# Patient Record
Sex: Female | Born: 1970 | Race: Black or African American | Hispanic: No | Marital: Single | Smoking: Never smoker
Health system: Southern US, Community
[De-identification: ages and names within clinical notes are randomized; demographics above are authoritative.]

## PROBLEM LIST (undated history)

## (undated) DIAGNOSIS — I509 Heart failure, unspecified: Secondary | ICD-10-CM

## (undated) HISTORY — DX: Heart failure, unspecified: I50.9

---

## 1998-05-07 ENCOUNTER — Inpatient Hospital Stay (HOSPITAL_COMMUNITY): Admission: AD | Admit: 1998-05-07 | Discharge: 1998-05-07 | Payer: Self-pay | Admitting: Obstetrics

## 1998-08-08 ENCOUNTER — Ambulatory Visit (HOSPITAL_COMMUNITY): Admission: RE | Admit: 1998-08-08 | Discharge: 1998-08-08 | Payer: Self-pay | Admitting: Obstetrics

## 1998-10-10 ENCOUNTER — Inpatient Hospital Stay (HOSPITAL_COMMUNITY): Admission: RE | Admit: 1998-10-10 | Discharge: 1998-10-10 | Payer: Self-pay | Admitting: Obstetrics

## 1998-10-11 ENCOUNTER — Encounter: Payer: Self-pay | Admitting: Obstetrics

## 1998-10-11 ENCOUNTER — Inpatient Hospital Stay (HOSPITAL_COMMUNITY): Admission: AD | Admit: 1998-10-11 | Discharge: 1998-10-12 | Payer: Self-pay | Admitting: *Deleted

## 1998-10-26 ENCOUNTER — Encounter: Admission: RE | Admit: 1998-10-26 | Discharge: 1998-10-26 | Payer: Self-pay | Admitting: Obstetrics & Gynecology

## 1998-10-30 ENCOUNTER — Inpatient Hospital Stay (HOSPITAL_COMMUNITY): Admission: AD | Admit: 1998-10-30 | Discharge: 1998-11-06 | Payer: Self-pay | Admitting: *Deleted

## 1998-11-01 ENCOUNTER — Encounter: Payer: Self-pay | Admitting: *Deleted

## 1998-11-08 ENCOUNTER — Inpatient Hospital Stay (HOSPITAL_COMMUNITY): Admission: AD | Admit: 1998-11-08 | Discharge: 1998-11-11 | Payer: Self-pay | Admitting: *Deleted

## 1998-11-12 HISTORY — PX: TUBAL LIGATION: SHX77

## 1998-11-17 ENCOUNTER — Encounter: Admission: RE | Admit: 1998-11-17 | Discharge: 1998-11-17 | Payer: Self-pay | Admitting: Obstetrics

## 1998-12-03 ENCOUNTER — Inpatient Hospital Stay (HOSPITAL_COMMUNITY): Admission: AD | Admit: 1998-12-03 | Discharge: 1998-12-05 | Payer: Self-pay | Admitting: Obstetrics

## 1999-02-19 ENCOUNTER — Emergency Department (HOSPITAL_COMMUNITY): Admission: EM | Admit: 1999-02-19 | Discharge: 1999-02-19 | Payer: Self-pay

## 1999-06-06 ENCOUNTER — Encounter: Payer: Self-pay | Admitting: Family Medicine

## 1999-06-06 ENCOUNTER — Ambulatory Visit (HOSPITAL_COMMUNITY): Admission: RE | Admit: 1999-06-06 | Discharge: 1999-06-06 | Payer: Self-pay | Admitting: Family Medicine

## 1999-11-21 ENCOUNTER — Inpatient Hospital Stay (HOSPITAL_COMMUNITY): Admission: AD | Admit: 1999-11-21 | Discharge: 1999-11-21 | Payer: Self-pay | Admitting: *Deleted

## 2000-08-14 ENCOUNTER — Emergency Department (HOSPITAL_COMMUNITY): Admission: EM | Admit: 2000-08-14 | Discharge: 2000-08-14 | Payer: Self-pay | Admitting: *Deleted

## 2000-08-20 ENCOUNTER — Emergency Department (HOSPITAL_COMMUNITY): Admission: EM | Admit: 2000-08-20 | Discharge: 2000-08-20 | Payer: Self-pay | Admitting: *Deleted

## 2001-08-15 ENCOUNTER — Encounter: Admission: RE | Admit: 2001-08-15 | Discharge: 2001-11-13 | Payer: Self-pay | Admitting: Orthopedic Surgery

## 2002-08-18 ENCOUNTER — Inpatient Hospital Stay (HOSPITAL_COMMUNITY): Admission: AD | Admit: 2002-08-18 | Discharge: 2002-08-18 | Payer: Self-pay | Admitting: *Deleted

## 2002-08-18 ENCOUNTER — Encounter: Payer: Self-pay | Admitting: *Deleted

## 2003-06-25 ENCOUNTER — Inpatient Hospital Stay (HOSPITAL_COMMUNITY): Admission: AD | Admit: 2003-06-25 | Discharge: 2003-06-25 | Payer: Self-pay | Admitting: Obstetrics & Gynecology

## 2003-07-01 ENCOUNTER — Inpatient Hospital Stay (HOSPITAL_COMMUNITY): Admission: AD | Admit: 2003-07-01 | Discharge: 2003-07-01 | Payer: Self-pay | Admitting: *Deleted

## 2003-07-16 ENCOUNTER — Emergency Department (HOSPITAL_COMMUNITY): Admission: EM | Admit: 2003-07-16 | Discharge: 2003-07-16 | Payer: Self-pay

## 2005-03-16 ENCOUNTER — Emergency Department (HOSPITAL_COMMUNITY): Admission: EM | Admit: 2005-03-16 | Discharge: 2005-03-16 | Payer: Self-pay | Admitting: Emergency Medicine

## 2013-11-09 ENCOUNTER — Other Ambulatory Visit: Payer: Self-pay

## 2017-09-27 ENCOUNTER — Emergency Department (HOSPITAL_COMMUNITY)
Admission: EM | Admit: 2017-09-27 | Discharge: 2017-09-27 | Disposition: A | Discharge status: Home / Self Care | Payer: No Typology Code available for payment source | Attending: Emergency Medicine | Admitting: Emergency Medicine

## 2017-09-27 ENCOUNTER — Emergency Department (HOSPITAL_COMMUNITY): Payer: No Typology Code available for payment source

## 2017-09-27 ENCOUNTER — Encounter (HOSPITAL_COMMUNITY): Payer: Self-pay | Admitting: Emergency Medicine

## 2017-09-27 DIAGNOSIS — Y999 Unspecified external cause status: Secondary | ICD-10-CM | POA: Insufficient documentation

## 2017-09-27 DIAGNOSIS — S0990XA Unspecified injury of head, initial encounter: Secondary | ICD-10-CM | POA: Insufficient documentation

## 2017-09-27 DIAGNOSIS — Y92410 Unspecified street and highway as the place of occurrence of the external cause: Secondary | ICD-10-CM | POA: Insufficient documentation

## 2017-09-27 DIAGNOSIS — Y9389 Activity, other specified: Secondary | ICD-10-CM | POA: Insufficient documentation

## 2017-09-27 DIAGNOSIS — S161XXA Strain of muscle, fascia and tendon at neck level, initial encounter: Secondary | ICD-10-CM | POA: Insufficient documentation

## 2017-09-27 DIAGNOSIS — S199XXA Unspecified injury of neck, initial encounter: Secondary | ICD-10-CM | POA: Diagnosis present

## 2017-09-27 MED ORDER — METHOCARBAMOL 500 MG PO TABS: 500.0000 mg | ORAL_TABLET | Freq: Two times a day (BID) | ORAL | 0 refills | Status: DC

## 2017-09-27 MED ORDER — METHOCARBAMOL 500 MG PO TABS
500.0000 mg | ORAL_TABLET | Freq: Once | ORAL | Status: AC
  Administered 2017-09-27: 500 mg via ORAL
  Filled 2017-09-27: qty 1

## 2017-09-27 MED ORDER — IBUPROFEN 800 MG PO TABS: 800.0000 mg | ORAL_TABLET | Freq: Three times a day (TID) | ORAL | 0 refills | Status: DC
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
## 2017-09-27 NOTE — ED Notes (Signed)
Report given to ED RN

## 2017-09-27 NOTE — Discharge Instructions (Signed)
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
## 2017-10-02 ENCOUNTER — Other Ambulatory Visit: Payer: Self-pay

## 2017-10-02 ENCOUNTER — Emergency Department (HOSPITAL_BASED_OUTPATIENT_CLINIC_OR_DEPARTMENT_OTHER)
Admission: EM | Admit: 2017-10-02 | Discharge: 2017-10-02 | Disposition: A | Discharge status: Home / Self Care | Payer: No Typology Code available for payment source | Attending: Emergency Medicine | Admitting: Emergency Medicine

## 2017-10-02 ENCOUNTER — Encounter (HOSPITAL_BASED_OUTPATIENT_CLINIC_OR_DEPARTMENT_OTHER): Payer: Self-pay | Admitting: Emergency Medicine

## 2017-10-02 DIAGNOSIS — R51 Headache: Secondary | ICD-10-CM | POA: Diagnosis present

## 2017-10-02 DIAGNOSIS — Y92410 Unspecified street and highway as the place of occurrence of the external cause: Secondary | ICD-10-CM | POA: Diagnosis not present

## 2017-10-02 DIAGNOSIS — Y999 Unspecified external cause status: Secondary | ICD-10-CM | POA: Insufficient documentation

## 2017-10-02 DIAGNOSIS — Y939 Activity, unspecified: Secondary | ICD-10-CM | POA: Diagnosis not present

## 2017-10-02 DIAGNOSIS — Y929 Unspecified place or not applicable: Secondary | ICD-10-CM | POA: Insufficient documentation

## 2017-10-02 MED ORDER — TRAMADOL HCL 50 MG PO TABS: 50.0000 mg | ORAL_TABLET | Freq: Four times a day (QID) | ORAL | 0 refills | Status: DC | PRN
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
## 2017-10-02 NOTE — ED Notes (Signed)
ED Provider at bedside. 

## 2017-10-02 NOTE — Discharge Instructions (Signed)
Stop ibuprofen and methocarbamol.  Take Tylenol for mild pain or the pain medicine prescribed for bad pain.  Call the number on these instructions in 2 days to get a primary care physician.  Your blood pressure should be rechecked by your primary care physician or at an urgent care center in a week.
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
## 2017-10-02 NOTE — ED Notes (Signed)
Pt requesting a work note for Monday and Tuesday, informed that the ED can only write notes for a few days, pt states "just forget it then."  Pt verbalizes understanding of dc instructions and denies any further needs at this time.

## 2017-10-02 NOTE — ED Notes (Signed)
Updated pt's visitor to plan of care
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
## 2020-05-13 ENCOUNTER — Encounter (HOSPITAL_COMMUNITY): Payer: Self-pay | Admitting: Emergency Medicine

## 2020-05-13 ENCOUNTER — Emergency Department (HOSPITAL_COMMUNITY): Payer: 59

## 2020-05-13 ENCOUNTER — Emergency Department (HOSPITAL_COMMUNITY)
Admission: EM | Admit: 2020-05-13 | Discharge: 2020-05-13 | Disposition: A | Discharge status: Home / Self Care | Payer: 59 | Attending: Emergency Medicine | Admitting: Emergency Medicine

## 2020-05-13 ENCOUNTER — Other Ambulatory Visit: Payer: Self-pay

## 2020-05-13 DIAGNOSIS — J40 Bronchitis, not specified as acute or chronic: Secondary | ICD-10-CM

## 2020-05-13 DIAGNOSIS — R911 Solitary pulmonary nodule: Secondary | ICD-10-CM | POA: Insufficient documentation

## 2020-05-13 DIAGNOSIS — R000 Tachycardia, unspecified: Secondary | ICD-10-CM | POA: Diagnosis not present

## 2020-05-13 DIAGNOSIS — Z79899 Other long term (current) drug therapy: Secondary | ICD-10-CM | POA: Insufficient documentation

## 2020-05-13 DIAGNOSIS — R042 Hemoptysis: Secondary | ICD-10-CM | POA: Diagnosis present

## 2020-05-13 LAB — CBC
HCT: 31.8 % — ABNORMAL LOW (ref 36.0–46.0)
Hemoglobin: 9.7 g/dL — ABNORMAL LOW (ref 12.0–15.0)
MCH: 22.4 pg — ABNORMAL LOW (ref 26.0–34.0)
MCHC: 30.5 g/dL (ref 30.0–36.0)
MCV: 73.4 fL — ABNORMAL LOW (ref 80.0–100.0)
Platelets: 498 10*3/uL — ABNORMAL HIGH (ref 150–400)
RBC: 4.33 MIL/uL (ref 3.87–5.11)
RDW: 18.5 % — ABNORMAL HIGH (ref 11.5–15.5)
WBC: 11.1 10*3/uL — ABNORMAL HIGH (ref 4.0–10.5)
nRBC: 0 % (ref 0.0–0.2)

## 2020-05-13 LAB — I-STAT BETA HCG BLOOD, ED (MC, WL, AP ONLY): I-stat hCG, quantitative: <5 m[IU]/mL (ref <5)

## 2020-05-13 LAB — BASIC METABOLIC PANEL
Anion gap: 13 (ref 5–15)
BUN: 11 mg/dL (ref 6–20)
CO2: 18 mmol/L — ABNORMAL LOW (ref 22–32)
Calcium: 8.6 mg/dL — ABNORMAL LOW (ref 8.9–10.3)
Chloride: 104 mmol/L (ref 98–111)
Creatinine, Ser: 0.99 mg/dL (ref 0.44–1.00)
GFR calc Af Amer: >60 mL/min (ref >60)
GFR calc non Af Amer: >60 mL/min (ref >60)
Glucose, Bld: 114 mg/dL — ABNORMAL HIGH (ref 70–99)
Potassium: 3.1 mmol/L — ABNORMAL LOW (ref 3.5–5.1)
Sodium: 135 mmol/L (ref 135–145)

## 2020-05-13 LAB — TROPONIN I (HIGH SENSITIVITY)
Troponin I (High Sensitivity): 36 ng/L — ABNORMAL HIGH (ref <18)
Troponin I (High Sensitivity): 33 ng/L — ABNORMAL HIGH (ref <18)

## 2020-05-13 MED ORDER — AZITHROMYCIN 250 MG PO TABS: 250.0000 mg | ORAL_TABLET | Freq: Every day | ORAL | 0 refills | Status: DC

## 2020-05-13 MED ORDER — IOHEXOL 350 MG/ML SOLN
80.0000 mL | Freq: Once | INTRAVENOUS | Status: AC | PRN
  Administered 2020-05-13: 80 mL via INTRAVENOUS

## 2020-05-13 MED ORDER — AMLODIPINE BESYLATE 5 MG PO TABS: 5.0000 mg | ORAL_TABLET | Freq: Every day | ORAL | 1 refills | Status: DC

## 2020-05-13 MED ORDER — SODIUM CHLORIDE 0.9% FLUSH
3.0000 mL | Freq: Once | INTRAVENOUS | Status: AC
  Administered 2020-05-13: 3 mL via INTRAVENOUS

## 2020-05-13 NOTE — ED Notes (Signed)
Provided pt with a bag lunch and a sprite per DR

## 2020-05-13 NOTE — ED Triage Notes (Signed)
Pt c/o productive cough with streaks of pink in sputum, shob, +chills/hot, shob, chest pressure

## 2020-05-13 NOTE — ED Provider Notes (Signed)
[facility] EMERGENCY DEPARTMENT Provider Note   CSN: [id number] Arrival date & time: 05/13/20  0138     History Chief Complaint  Patient presents with  . Hemoptysis    Nancy Branch is a 49 y.o. female.  Patient presenting with a complaint of a productive cough with streaks of pink sputum.  Some shortness of breath chills and hot feeling.  And some chest pressure.  Patient vital signs significant for persistent tachycardia.  Came in with a heart rate of 129.  No fever.  Respiratory rate 20 blood pressure 173/108.  Oxygen saturations 100%.  Patient does have a primary care doctor apparently has been referred to cardiology regarding the fast heart rate which is been going on for several weeks now.        History reviewed. No pertinent past medical history.  There are no problems to display for this patient.   History reviewed. No pertinent surgical history.   OB History   No obstetric history on file.     No family history on file.  Social History   Tobacco Use  . Smoking status: Never Smoker  . Smokeless tobacco: Never Used  Vaping Use  . Vaping Use: Never used  Substance Use Topics  . Alcohol use: Yes    Comment: occasional  . Drug use: Not Currently    Home Medications Prior to Admission medications   Medication Sig Start Date End Date Taking? Authorizing Provider  cetirizine (ZYRTEC) 10 MG tablet Take 10 mg by mouth daily as needed for allergies. 03/31/20  Yes [provider]  ELDERBERRY PO Take 1 tablet by mouth daily.   Yes [provider]  ferrous sulfate 325 (65 FE) MG tablet Take 325 mg by mouth daily with breakfast.   Yes [provider]  fluticasone (FLONASE) 50 MCG/ACT nasal spray Place 2 sprays into both nostrils daily. 03/31/20  Yes [provider]  meloxicam (MOBIC) 15 MG tablet Take 15 mg by mouth daily as needed for pain. 03/31/20  Yes [provider]  montelukast (SINGULAIR) 10 MG  tablet Take 10 mg by mouth daily. 04/26/20  Yes [provider]  telmisartan (MICARDIS) 40 MG tablet Take 40 mg by mouth daily. 03/31/20  Yes [provider]  vitamin B-12 (CYANOCOBALAMIN) 500 MCG tablet Take 500 mcg by mouth daily.   Yes [provider]  zinc gluconate 50 MG tablet Take 50 mg by mouth daily.   Yes [provider]  amLODipine (NORVASC) 5 MG tablet Take 1 tablet (5 mg total) by mouth daily. 05/13/20   Vanetta Mulders, MD  azithromycin (ZITHROMAX) 250 MG tablet Take 1 tablet (250 mg total) by mouth daily. Take first 2 tablets together, then 1 every day until finished. 05/13/20   Vanetta Mulders, MD  ibuprofen (ADVIL,MOTRIN) 800 MG tablet Take 1 tablet (800 mg total) 3 (three) times daily by mouth. Patient not taking: Reported on 05/13/2020 09/27/17   Long, Arlyss Repress, MD  methocarbamol (ROBAXIN) 500 MG tablet Take 1 tablet (500 mg total) 2 (two) times daily by mouth. Patient not taking: Reported on 05/13/2020 09/27/17   Long, Arlyss Repress, MD  traMADol (ULTRAM) 50 MG tablet Take 1 tablet (50 mg total) by mouth every 6 (six) hours as needed. Patient not taking: Reported on 05/13/2020 10/02/17   Doug Sou, MD  triamcinolone cream (KENALOG) 0.1 % Apply 1 application topically 2 (two) times daily. 05/04/20   [provider]    Allergies  Robaxin [methocarbamol] and Oxycodone  Review of Systems   Review of Systems  Constitutional: Negative for chills and fever.  HENT: Negative for congestion, rhinorrhea and sore throat.   Eyes: Negative for visual disturbance.  Respiratory: Positive for cough and shortness of breath.   Cardiovascular: Positive for chest pain, palpitations and leg swelling.  Gastrointestinal: Negative for abdominal pain, diarrhea, nausea and vomiting.  Genitourinary: Negative for dysuria.  Musculoskeletal: Negative for back pain and neck pain.  Skin: Negative for rash.  Neurological: Negative for dizziness, light-headedness  and headaches.  Hematological: Does not bruise/bleed easily.  Psychiatric/Behavioral: Negative for confusion.    Physical Exam Updated Vital Signs BP (!) 163/112   Pulse (!) 108   Temp 98.6 F (37 C) (Oral)   Resp (!) 28   SpO2 100%   Physical Exam Vitals and nursing note reviewed.  Constitutional:      General: She is not in acute distress.    Appearance: Normal appearance. She is well-developed.  HENT:     Head: Normocephalic and atraumatic.  Eyes:     Extraocular Movements: Extraocular movements intact.     Conjunctiva/sclera: Conjunctivae normal.     Pupils: Pupils are equal, round, and reactive to light.  Cardiovascular:     Rate and Rhythm: Regular rhythm. Tachycardia present.     Heart sounds: No murmur heard.   Pulmonary:     Effort: Pulmonary effort is normal. No respiratory distress.     Breath sounds: Normal breath sounds.  Abdominal:     Palpations: Abdomen is soft.     Tenderness: There is no abdominal tenderness.  Musculoskeletal:        General: No swelling. Normal range of motion.     Cervical back: Normal range of motion and neck supple.     Comments: Patient felt her legs were swollen.  But no evidence of any swelling on exam.  Certainly no pitting edema.  Skin:    General: Skin is warm and dry.  Neurological:     General: No focal deficit present.     Mental Status: She is alert and oriented to person, place, and time.     ED Results / Procedures / Treatments   Labs (all labs ordered are listed, but only abnormal results are displayed) Labs Reviewed  BASIC METABOLIC PANEL - Abnormal; Notable for the following components:      Result Value   Potassium 3.1 (*)    CO2 18 (*)    Glucose, Bld 114 (*)    Calcium 8.6 (*)    All other components within normal limits  CBC - Abnormal; Notable for the following components:   WBC 11.1 (*)    Hemoglobin 9.7 (*)    HCT 31.8 (*)    MCV 73.4 (*)    MCH 22.4 (*)    RDW 18.5 (*)    Platelets 498 (*)      All other components within normal limits  TROPONIN I (HIGH SENSITIVITY) - Abnormal; Notable for the following components:   Troponin I (High Sensitivity) 36 (*)    All other components within normal limits  TROPONIN I (HIGH SENSITIVITY) - Abnormal; Notable for the following components:   Troponin I (High Sensitivity) 33 (*)    All other components within normal limits  I-STAT BETA HCG BLOOD, ED ([facility], [facility], [facility] ONLY)    EKG EKG Interpretation  Date/Time:  Friday May 13 2020 01:46:53 EDT Ventricular Rate:  124 PR Interval:  122 QRS Duration:  106 QT Interval:  352 QTC Calculation: 505 R Axis:   -67 Text Interpretation: Sinus tachycardia with Premature atrial complexes Possible Left atrial enlargement Left axis deviation Pulmonary disease pattern Minimal voltage criteria for LVH, may be normal variant ( Cornell product ) Septal infarct , age undetermined Inferior infarct , age undetermined ST & T wave abnormality, consider lateral ischemia Abnormal ECG No previous ECGs available Confirmed by Vanetta Mulders [id number]) on 05/13/2020 8:26:15 AM   Radiology DG Chest 2 View  Result Date: 05/13/2020 CLINICAL DATA:  Chest pain, productive cough, shortness of breath EXAM: CHEST - 2 VIEW COMPARISON:  None. FINDINGS: The heart size and mediastinal contours are within normal limits. Both lungs are clear. The visualized skeletal structures are unremarkable. IMPRESSION: No active cardiopulmonary disease. Electronically Signed   By: Sharlet Salina M.D.   On: 05/13/2020 02:13   CT Angio Chest PE W/Cm &/Or Wo Cm  Result Date: 05/13/2020 CLINICAL DATA:  Shortness of breath. EXAM: CT ANGIOGRAPHY CHEST WITH CONTRAST TECHNIQUE: Multidetector CT imaging of the chest was performed using the standard protocol during bolus administration of intravenous contrast. Multiplanar CT image reconstructions and MIPs were obtained to evaluate the vascular anatomy. CONTRAST:  42mL OMNIPAQUE IOHEXOL 350 MG/ML SOLN COMPARISON:   None. FINDINGS: Cardiovascular: Satisfactory opacification of the pulmonary arteries to the segmental level. No evidence of pulmonary embolism. Normal heart size. No pericardial effusion. Mediastinum/Nodes: No enlarged mediastinal, hilar, or axillary lymph nodes. Thyroid gland, trachea, and esophagus demonstrate no significant findings. Lungs/Pleura: No pneumothorax or pleural effusion is noted. Minimal biapical scarring is noted 7 mm ground-glass opacity is noted in right upper lobe best seen on image number 48 of series 6. Upper Abdomen: 2.9 cm low density is noted in dome of right hepatic lobe Musculoskeletal: No chest wall abnormality. No acute or significant osseous findings. Review of the MIP images confirms the above findings. IMPRESSION: 1. No definite evidence of pulmonary embolus. 2. 7 mm ground-glass opacity is noted in right upper lobe. Follow-up CT scan in 3 months is recommended to ensure stability or resolution. 3. 2.9 cm low density is noted in dome of right hepatic lobe. Hepatic ultrasound is recommended for further evaluation. Electronically Signed   By: Lupita Raider M.D.   On: 05/13/2020 12:24    Procedures Procedures (including critical care time)  Medications Ordered in ED Medications  sodium chloride flush (NS) 0.9 % injection 3 mL (3 mLs Intravenous Given 05/13/20 0823)  iohexol (OMNIPAQUE) 350 MG/ML injection 80 mL (80 mLs Intravenous Contrast Given 05/13/20 1150)    ED Course  I have reviewed the triage vital signs and the nursing notes.  Pertinent labs & imaging results that were available during my care of the patient were reviewed by me and considered in my medical decision making (see chart for details).    MDM Rules/Calculators/A&P                          Patient's troponins x2 were in the 30s but the second 1 was lower.  EKG was kind of sinus tachycardia with some premature atrial contractions.  Initial chest x-ray without any acute findings but due to the  persistent type tachycardia patient get CT angio chest.  I which showed no evidence of pulmonary embolus.  Also showed no evidence of pneumonia.  So her productive cough with some streaking of blood most likely secondary to a bronchitis.  Was treated with a Z-Pak for that.  Patient's blood pressure remained elevated here.  So we will start her on Norvasc for that and have her follow-up as already scheduled with cardiology and will go back to follow-up with her primary care doctor for the pulmonary nodule found on the CT angio.  And also for evaluation of the cyst found on the liver.  None of that requires immediate attention today.    Final Clinical Impression(s) / ED Diagnoses Final diagnoses:  Tachycardia  Bronchitis  Pulmonary nodule    Rx / DC Orders ED Discharge Orders         Ordered    azithromycin (ZITHROMAX) 250 MG tablet  Daily     Discontinue  Reprint     05/13/20 1436    amLODipine (NORVASC) 5 MG tablet  Daily     Discontinue  Reprint     05/13/20 1439           Vanetta Mulders, MD 05/13/20 1447

## 2020-05-13 NOTE — ED Notes (Signed)
Reviewed discharge paperwork with patient. No further questions for ED staff at this time.

## 2020-05-13 NOTE — Discharge Instructions (Signed)
Work-up here today without evidence of pneumonia or blood clots on the CT of the chest.  But he picked up a pulmonary nodule and cyst on the liver both which will require follow-up.  Feel that some of your symptoms are due to a bronchitis with take the antibiotic as Z-Pak as directed.  Make an appointment to follow-up with your regular doctor.  Keep your appointment to follow-up with cardiology.  Return for any new or worse symptoms.  Also evidence of high blood pressure here today.  Follow-up with your primary care doctor to see the trend.  If it trends the way it is will be needed if started on some antihypertensive meds.  Cardiology may be willing to address that as well.  Cardiac work-up here today without any acute findings.  Prescription provided to start Norvasc.  For the high blood pressure.

## 2020-05-13 NOTE — ED Notes (Signed)
Pt transported to CT ?

## 2020-05-13 NOTE — ED Notes (Signed)
Got patient into a gown on the monitor patient is resting with call bell in reach 

## 2020-07-16 ENCOUNTER — Inpatient Hospital Stay (HOSPITAL_COMMUNITY)
Admission: EM | Admit: 2020-07-16 | Discharge: 2020-07-19 | DRG: 293 | Disposition: A | Discharge status: Home / Self Care | Payer: 59 | Attending: Internal Medicine | Admitting: Internal Medicine

## 2020-07-16 ENCOUNTER — Encounter (HOSPITAL_COMMUNITY): Payer: Self-pay | Admitting: Emergency Medicine

## 2020-07-16 ENCOUNTER — Emergency Department (HOSPITAL_COMMUNITY): Payer: 59

## 2020-07-16 ENCOUNTER — Other Ambulatory Visit: Payer: Self-pay

## 2020-07-16 DIAGNOSIS — R600 Localized edema: Secondary | ICD-10-CM

## 2020-07-16 DIAGNOSIS — R0601 Orthopnea: Secondary | ICD-10-CM

## 2020-07-16 DIAGNOSIS — I110 Hypertensive heart disease with heart failure: Principal | ICD-10-CM | POA: Diagnosis present

## 2020-07-16 DIAGNOSIS — Z20822 Contact with and (suspected) exposure to covid-19: Secondary | ICD-10-CM | POA: Diagnosis present

## 2020-07-16 DIAGNOSIS — Z23 Encounter for immunization: Secondary | ICD-10-CM

## 2020-07-16 DIAGNOSIS — R609 Edema, unspecified: Secondary | ICD-10-CM | POA: Diagnosis present

## 2020-07-16 DIAGNOSIS — I10 Essential (primary) hypertension: Secondary | ICD-10-CM | POA: Diagnosis present

## 2020-07-16 DIAGNOSIS — I509 Heart failure, unspecified: Secondary | ICD-10-CM

## 2020-07-16 DIAGNOSIS — K760 Fatty (change of) liver, not elsewhere classified: Secondary | ICD-10-CM | POA: Diagnosis present

## 2020-07-16 DIAGNOSIS — I43 Cardiomyopathy in diseases classified elsewhere: Secondary | ICD-10-CM | POA: Diagnosis present

## 2020-07-16 DIAGNOSIS — R002 Palpitations: Secondary | ICD-10-CM | POA: Diagnosis not present

## 2020-07-16 DIAGNOSIS — R072 Precordial pain: Secondary | ICD-10-CM

## 2020-07-16 DIAGNOSIS — I5041 Acute combined systolic (congestive) and diastolic (congestive) heart failure: Secondary | ICD-10-CM | POA: Diagnosis present

## 2020-07-16 DIAGNOSIS — R7989 Other specified abnormal findings of blood chemistry: Secondary | ICD-10-CM

## 2020-07-16 DIAGNOSIS — R079 Chest pain, unspecified: Secondary | ICD-10-CM | POA: Diagnosis present

## 2020-07-16 DIAGNOSIS — E876 Hypokalemia: Secondary | ICD-10-CM | POA: Diagnosis present

## 2020-07-16 DIAGNOSIS — I5020 Unspecified systolic (congestive) heart failure: Secondary | ICD-10-CM

## 2020-07-16 DIAGNOSIS — J42 Unspecified chronic bronchitis: Secondary | ICD-10-CM | POA: Diagnosis present

## 2020-07-16 LAB — CBC
HCT: 31.6 % — ABNORMAL LOW (ref 36.0–46.0)
Hemoglobin: 9.4 g/dL — ABNORMAL LOW (ref 12.0–15.0)
MCH: 20.5 pg — ABNORMAL LOW (ref 26.0–34.0)
MCHC: 29.7 g/dL — ABNORMAL LOW (ref 30.0–36.0)
MCV: 69 fL — ABNORMAL LOW (ref 80.0–100.0)
Platelets: 489 10*3/uL — ABNORMAL HIGH (ref 150–400)
RBC: 4.58 MIL/uL (ref 3.87–5.11)
RDW: 20.4 % — ABNORMAL HIGH (ref 11.5–15.5)
WBC: 8.5 10*3/uL (ref 4.0–10.5)
nRBC: 0.8 % — ABNORMAL HIGH (ref 0.0–0.2)

## 2020-07-16 LAB — BASIC METABOLIC PANEL
Anion gap: 11 (ref 5–15)
BUN: 18 mg/dL (ref 6–20)
CO2: 21 mmol/L — ABNORMAL LOW (ref 22–32)
Calcium: 8.7 mg/dL — ABNORMAL LOW (ref 8.9–10.3)
Chloride: 106 mmol/L (ref 98–111)
Creatinine, Ser: 1.08 mg/dL — ABNORMAL HIGH (ref 0.44–1.00)
GFR calc Af Amer: >60 mL/min (ref >60)
GFR calc non Af Amer: >60 mL/min (ref >60)
Glucose, Bld: 112 mg/dL — ABNORMAL HIGH (ref 70–99)
Potassium: 3.9 mmol/L (ref 3.5–5.1)
Sodium: 138 mmol/L (ref 135–145)

## 2020-07-16 LAB — I-STAT BETA HCG BLOOD, ED (MC, WL, AP ONLY): I-stat hCG, quantitative: <5 m[IU]/mL (ref <5)

## 2020-07-16 LAB — BRAIN NATRIURETIC PEPTIDE: B Natriuretic Peptide: 3260.2 pg/mL — ABNORMAL HIGH (ref 0.0–100.0)

## 2020-07-16 LAB — TROPONIN I (HIGH SENSITIVITY)
Troponin I (High Sensitivity): 34 ng/L — ABNORMAL HIGH (ref <18)
Troponin I (High Sensitivity): 30 ng/L — ABNORMAL HIGH (ref <18)

## 2020-07-16 MED ORDER — ACETAMINOPHEN 500 MG PO TABS
1000.0000 mg | ORAL_TABLET | Freq: Once | ORAL | Status: AC
  Administered 2020-07-16: 1000 mg via ORAL
  Filled 2020-07-16: qty 2

## 2020-07-16 MED ORDER — IOHEXOL 300 MG/ML  SOLN
100.0000 mL | Freq: Once | INTRAMUSCULAR | Status: AC | PRN
  Administered 2020-07-16: 100 mL via INTRAVENOUS

## 2020-07-16 NOTE — ED Triage Notes (Addendum)
Pt presents to ED POV. Pt c/o L CP and BLE edema. Pt reports that s/s began x2w. Pt also c/o orthopnea and reports copious productive cough when lying flat. tachynpneic in triage

## 2020-07-16 NOTE — ED Provider Notes (Addendum)
[facility] EMERGENCY DEPARTMENT Provider Note   CSN: [id number] Arrival date & time: 07/16/20  1910     History Chief Complaint  Patient presents with  . Chest Pain    Nancy Branch is a 49 y.o. female.  Patient seen by me on July 2 for very similar presentation.  What is new that now she has bilateral leg swelling.  Patient with complaint of substernal chest pain shortness of breath.  And a productive cough.  Chest pain is been ongoing since yesterday.  But has been present since about a week ago.  End of August patient was seen by cardiology on August 26.  Also seen by pulmonary medicine August 31.  They are in the process of trying to evaluate this complaint.  When I saw patient on July 2 we did CT angio of the chest without evidence of any pulmonary abnormalities or pulmonary embolus.  Also troponins x2 were in the 30 range.  Patient did not have any leg swelling at that time.  Pulmonary medicine and cardiology in the process of trying to work-up the complaint.  Denies any fevers.        History reviewed. No pertinent past medical history.  There are no problems to display for this patient.   History reviewed. No pertinent surgical history.   OB History   No obstetric history on file.     History reviewed. No pertinent family history.  Social History   Tobacco Use  . Smoking status: Never Smoker  . Smokeless tobacco: Never Used  Vaping Use  . Vaping Use: Never used  Substance Use Topics  . Alcohol use: Yes    Comment: occasional  . Drug use: Not Currently    Home Medications Prior to Admission medications   Medication Sig Start Date End Date Taking? Authorizing Provider  amLODipine (NORVASC) 5 MG tablet Take 1 tablet (5 mg total) by mouth daily. 05/13/20   Vanetta MuldersZackowski, Viviana Trimble, MD  azithromycin (ZITHROMAX) 250 MG tablet Take 1 tablet (250 mg total) by mouth daily. Take first 2 tablets together, then 1 every day until finished. 05/13/20   Vanetta MuldersZackowski,  Marvalene Barrett, MD  cetirizine (ZYRTEC) 10 MG tablet Take 10 mg by mouth daily as needed for allergies. 03/31/20   [provider]  ELDERBERRY PO Take 1 tablet by mouth daily.    [provider]  ferrous sulfate 325 (65 FE) MG tablet Take 325 mg by mouth daily with breakfast.    [provider]  fluticasone (FLONASE) 50 MCG/ACT nasal spray Place 2 sprays into both nostrils daily. 03/31/20   [provider]  ibuprofen (ADVIL,MOTRIN) 800 MG tablet Take 1 tablet (800 mg total) 3 (three) times daily by mouth. Patient not taking: Reported on 05/13/2020 09/27/17   Long, Arlyss RepressJoshua G, MD  meloxicam (MOBIC) 15 MG tablet Take 15 mg by mouth daily as needed for pain. 03/31/20   [provider]  methocarbamol (ROBAXIN) 500 MG tablet Take 1 tablet (500 mg total) 2 (two) times daily by mouth. Patient not taking: Reported on 05/13/2020 09/27/17   Long, Arlyss RepressJoshua G, MD  montelukast (SINGULAIR) 10 MG tablet Take 10 mg by mouth daily. 04/26/20   [provider]  telmisartan (MICARDIS) 40 MG tablet Take 40 mg by mouth daily. 03/31/20   [provider]  traMADol (ULTRAM) 50 MG tablet Take 1 tablet (50 mg total) by mouth every 6 (six) hours as needed. Patient not taking: Reported on 05/13/2020 10/02/17   Jacubowitz,  Sam, MD  triamcinolone cream (KENALOG) 0.1 % Apply 1 application topically 2 (two) times daily. 05/04/20   [provider]  vitamin B-12 (CYANOCOBALAMIN) 500 MCG tablet Take 500 mcg by mouth daily.    [provider]  zinc gluconate 50 MG tablet Take 50 mg by mouth daily.    [provider]    Allergies    Robaxin [methocarbamol] and Oxycodone  Review of Systems   Review of Systems  Constitutional: Negative for chills and fever.  HENT: Negative for rhinorrhea and sore throat.   Eyes: Negative for visual disturbance.  Respiratory: Positive for cough and shortness of breath.   Cardiovascular: Positive for chest pain and leg swelling.    Gastrointestinal: Negative for abdominal pain, diarrhea, nausea and vomiting.  Genitourinary: Negative for dysuria.  Musculoskeletal: Negative for back pain and neck pain.  Skin: Negative for rash.  Neurological: Negative for dizziness, light-headedness and headaches.  Hematological: Does not bruise/bleed easily.  Psychiatric/Behavioral: Negative for confusion.    Physical Exam Updated Vital Signs BP (!) 144/90 (BP Location: Right Arm)   Pulse (!) 113   Temp 99.6 F (37.6 C) (Oral)   Resp 19   SpO2 100%   Physical Exam Vitals and nursing note reviewed.  Constitutional:      General: She is not in acute distress.    Appearance: Normal appearance. She is well-developed.  HENT:     Head: Normocephalic and atraumatic.  Eyes:     Extraocular Movements: Extraocular movements intact.     Conjunctiva/sclera: Conjunctivae normal.     Pupils: Pupils are equal, round, and reactive to light.  Cardiovascular:     Rate and Rhythm: Regular rhythm. Tachycardia present.     Heart sounds: No murmur heard.   Pulmonary:     Effort: Pulmonary effort is normal. No respiratory distress.     Breath sounds: Normal breath sounds. No wheezing.  Abdominal:     Palpations: Abdomen is soft.     Tenderness: There is no abdominal tenderness.  Musculoskeletal:     Cervical back: Normal range of motion and neck supple.     Right lower leg: Edema present.     Left lower leg: Edema present.  Skin:    General: Skin is warm and dry.     Capillary Refill: Capillary refill takes less than 2 seconds.  Neurological:     General: No focal deficit present.     Mental Status: She is alert and oriented to person, place, and time.     Cranial Nerves: No cranial nerve deficit.     Sensory: No sensory deficit.     Motor: No weakness.     ED Results / Procedures / Treatments   Labs (all labs ordered are listed, but only abnormal results are displayed) Labs Reviewed  BASIC METABOLIC PANEL - Abnormal;  Notable for the following components:      Result Value   CO2 21 (*)    Glucose, Bld 112 (*)    Creatinine, Ser 1.08 (*)    Calcium 8.7 (*)    All other components within normal limits  CBC - Abnormal; Notable for the following components:   Hemoglobin 9.4 (*)    HCT 31.6 (*)    MCV 69.0 (*)    MCH 20.5 (*)    MCHC 29.7 (*)    RDW 20.4 (*)    Platelets 489 (*)    nRBC 0.8 (*)    All other components within normal limits  BRAIN NATRIURETIC PEPTIDE - Abnormal; Notable for the following components:   B Natriuretic Peptide 3,260.2 (*)    All other components within normal limits  TROPONIN I (HIGH SENSITIVITY) - Abnormal; Notable for the following components:   Troponin I (High Sensitivity) 34 (*)    All other components within normal limits  I-STAT BETA HCG BLOOD, ED ([facility], [facility], [facility] ONLY)  TROPONIN I (HIGH SENSITIVITY)    EKG EKG Interpretation  Date/Time:  Saturday July 16 2020 19:15:54 EDT Ventricular Rate:  121 PR Interval:  128 QRS Duration: 96 QT Interval:  356 QTC Calculation: 505 R Axis:   -58 Text Interpretation: Sinus tachycardia with Premature atrial complexes with Abberant conduction Possible Left atrial enlargement Left axis deviation Anterior infarct , age undetermined Abnormal ECG No significant change since last tracing Confirmed by Vanetta Mulders [id number]) on 07/16/2020 8:57:37 PM   Radiology DG Chest 2 View  Result Date: 07/16/2020 CLINICAL DATA:  Chest pain EXAM: CHEST - 2 VIEW COMPARISON:  May 13, 2020 FINDINGS: The heart size and mediastinal contours are within normal limits. Aortic knob calcifications are seen. Both lungs are clear. The visualized skeletal structures are unremarkable. IMPRESSION: No active cardiopulmonary disease. Electronically Signed   By: Jonna Clark M.D.   On: 07/16/2020 20:10    Procedures Procedures (including critical care time)  Medications Ordered in ED Medications - No data to display  ED Course  I have reviewed the  triage vital signs and the nursing notes.  Pertinent labs & imaging results that were available during my care of the patient were reviewed by me and considered in my medical decision making (see chart for details).    MDM Rules/Calculators/A&P                            Patient EKG without any acute findings.  Tachycardia in the low 100s.  Oxygen saturation 100% on room air.  Respiratory rate is 18.  Blood pressure 146 systolic.  Temp was 99.6.  Chest x-ray again without any acute changes.  Renal function without any significant abnormalities.  No significant changes in hemoglobin at 9.4 from July.  Initial troponin here today was 34.  The patient since has had worsening chest pain today we will do delta troponin.  Also have added on BMP since she does have bilateral lower extremity edema.  Patient denies any abdominal pain.  Again as stated chest x-ray shows no evidence of any pulmonary edema.  Will follow up on BMP will follow up on delta troponin.  If no acute changes we will have patient follow back up with pulmonary medicine and cardiology that she is already plugged into.  They are planning additional follow-up.  Patient nontoxic no acute distress.     Final Clinical Impression(s) / ED Diagnoses Final diagnoses:  Precordial pain  Palpitations    Rx / DC Orders ED Discharge Orders    None       Vanetta Mulders, MD 07/16/20 2143  Addendum: I ordered a BMP this time because of the leg swelling which was new compared to the last time I saw her in July.  BNP was markedly elevated.  Delta troponin is still pending.  Based on the elevated BNP and her significant leg swelling.  Will order just a CT chest with out.  Not worried about pulmonary embolus.  Patient had CT angio back in July that was completely negative.  Concern here would be congestive  heart failure.  Even though plain chest x-ray showed no evidence of any pulmonary edema.      Vanetta Mulders, MD 07/16/20  2219  CT of chest shows no evidence of pulmonary edema or any enlargement of heart size.  So patient's markedly elevated BNP not explained by congestive heart failure.  Or even probable pulmonary hypertension.  Patient does have significant lower extremity swelling.  Some concern now with a negative CT that may be there is something obstructing in the pelvis or abdomen area.  We will go ahead and get CT abdomen and pelvis with contrast since patient is here and try to get this sorted out.    Vanetta Mulders, MD 07/16/20 [phone]

## 2020-07-16 NOTE — ED Notes (Signed)
Pt transported to CT ?

## 2020-07-17 ENCOUNTER — Inpatient Hospital Stay (HOSPITAL_COMMUNITY): Payer: 59

## 2020-07-17 DIAGNOSIS — J42 Unspecified chronic bronchitis: Secondary | ICD-10-CM | POA: Diagnosis present

## 2020-07-17 DIAGNOSIS — I43 Cardiomyopathy in diseases classified elsewhere: Secondary | ICD-10-CM | POA: Diagnosis present

## 2020-07-17 DIAGNOSIS — E876 Hypokalemia: Secondary | ICD-10-CM | POA: Diagnosis present

## 2020-07-17 DIAGNOSIS — R600 Localized edema: Secondary | ICD-10-CM

## 2020-07-17 DIAGNOSIS — R079 Chest pain, unspecified: Secondary | ICD-10-CM | POA: Diagnosis not present

## 2020-07-17 DIAGNOSIS — R0601 Orthopnea: Secondary | ICD-10-CM | POA: Diagnosis not present

## 2020-07-17 DIAGNOSIS — R609 Edema, unspecified: Secondary | ICD-10-CM

## 2020-07-17 DIAGNOSIS — I509 Heart failure, unspecified: Secondary | ICD-10-CM | POA: Diagnosis not present

## 2020-07-17 DIAGNOSIS — I10 Essential (primary) hypertension: Secondary | ICD-10-CM

## 2020-07-17 DIAGNOSIS — Z20822 Contact with and (suspected) exposure to covid-19: Secondary | ICD-10-CM | POA: Diagnosis present

## 2020-07-17 DIAGNOSIS — Z23 Encounter for immunization: Secondary | ICD-10-CM | POA: Diagnosis not present

## 2020-07-17 DIAGNOSIS — I5021 Acute systolic (congestive) heart failure: Secondary | ICD-10-CM | POA: Diagnosis not present

## 2020-07-17 DIAGNOSIS — I110 Hypertensive heart disease with heart failure: Secondary | ICD-10-CM | POA: Diagnosis present

## 2020-07-17 DIAGNOSIS — R002 Palpitations: Secondary | ICD-10-CM | POA: Diagnosis present

## 2020-07-17 DIAGNOSIS — I5041 Acute combined systolic (congestive) and diastolic (congestive) heart failure: Secondary | ICD-10-CM | POA: Diagnosis present

## 2020-07-17 DIAGNOSIS — K760 Fatty (change of) liver, not elsewhere classified: Secondary | ICD-10-CM | POA: Diagnosis present

## 2020-07-17 LAB — ECHOCARDIOGRAM COMPLETE
Area-P 1/2: 3.77 cm2
Height: 65 in
S' Lateral: 4.2 cm
Weight: 2566.4 oz

## 2020-07-17 LAB — SARS CORONAVIRUS 2 BY RT PCR (HOSPITAL ORDER, PERFORMED IN ~~LOC~~ HOSPITAL LAB): SARS Coronavirus 2: NEGATIVE

## 2020-07-17 LAB — TROPONIN I (HIGH SENSITIVITY): Troponin I (High Sensitivity): 32 ng/L — ABNORMAL HIGH (ref <18)

## 2020-07-17 LAB — HIV ANTIBODY (ROUTINE TESTING W REFLEX): HIV Screen 4th Generation wRfx: NONREACTIVE

## 2020-07-17 MED ORDER — CARVEDILOL 3.125 MG PO TABS: 3.1250 mg | ORAL_TABLET | Freq: Two times a day (BID) | ORAL | Status: DC

## 2020-07-17 MED ORDER — TRAMADOL HCL 50 MG PO TABS
50.0000 mg | ORAL_TABLET | Freq: Four times a day (QID) | ORAL | Status: DC | PRN
  Administered 2020-07-17 – 2020-07-18 (×4): 50 mg via ORAL
  Filled 2020-07-17 (×4): qty 1

## 2020-07-17 MED ORDER — ZINC SULFATE 220 (50 ZN) MG PO CAPS
220.0000 mg | ORAL_CAPSULE | Freq: Every day | ORAL | Status: DC
  Administered 2020-07-17 – 2020-07-19 (×3): 220 mg via ORAL
  Filled 2020-07-17 (×3): qty 1

## 2020-07-17 MED ORDER — AMLODIPINE BESYLATE 5 MG PO TABS: 5.0000 mg | ORAL_TABLET | Freq: Every day | ORAL | Status: DC

## 2020-07-17 MED ORDER — ASPIRIN EC 81 MG PO TBEC
81.0000 mg | DELAYED_RELEASE_TABLET | Freq: Every day | ORAL | Status: DC
  Administered 2020-07-17 – 2020-07-19 (×3): 81 mg via ORAL
  Filled 2020-07-17 (×3): qty 1

## 2020-07-17 MED ORDER — ONDANSETRON HCL 4 MG/2ML IJ SOLN: 4.0000 mg | Freq: Four times a day (QID) | INTRAMUSCULAR | Status: DC | PRN

## 2020-07-17 MED ORDER — FERROUS SULFATE 325 (65 FE) MG PO TABS
325.0000 mg | ORAL_TABLET | Freq: Every day | ORAL | Status: DC
  Administered 2020-07-17 – 2020-07-19 (×3): 325 mg via ORAL
  Filled 2020-07-17 (×3): qty 1

## 2020-07-17 MED ORDER — ENOXAPARIN SODIUM 40 MG/0.4ML ~~LOC~~ SOLN
40.0000 mg | Freq: Every day | SUBCUTANEOUS | Status: DC
  Administered 2020-07-17 – 2020-07-19 (×3): 40 mg via SUBCUTANEOUS
  Filled 2020-07-17 (×3): qty 0.4

## 2020-07-17 MED ORDER — PNEUMOCOCCAL VAC POLYVALENT 25 MCG/0.5ML IJ INJ
0.5000 mL | INJECTION | INTRAMUSCULAR | Status: AC
  Administered 2020-07-19: 0.5 mL via INTRAMUSCULAR
  Filled 2020-07-17: qty 0.5

## 2020-07-17 MED ORDER — SODIUM CHLORIDE 0.9% FLUSH
3.0000 mL | INTRAVENOUS | Status: DC | PRN
  Administered 2020-07-17: 3 mL via INTRAVENOUS

## 2020-07-17 MED ORDER — FLUTICASONE PROPIONATE 50 MCG/ACT NA SUSP
2.0000 | Freq: Every day | NASAL | Status: DC | PRN
  Filled 2020-07-17: qty 16

## 2020-07-17 MED ORDER — SODIUM CHLORIDE 0.9 % IV SOLN: 250.0000 mL | INTRAVENOUS | Status: DC | PRN

## 2020-07-17 MED ORDER — PREDNISONE 20 MG PO TABS
40.0000 mg | ORAL_TABLET | Freq: Every day | ORAL | Status: DC
  Administered 2020-07-17 – 2020-07-19 (×3): 40 mg via ORAL
  Filled 2020-07-17 (×3): qty 2

## 2020-07-17 MED ORDER — SODIUM CHLORIDE 0.9% FLUSH
3.0000 mL | Freq: Two times a day (BID) | INTRAVENOUS | Status: DC
  Administered 2020-07-17 – 2020-07-19 (×5): 3 mL via INTRAVENOUS

## 2020-07-17 MED ORDER — HYDRALAZINE HCL 25 MG PO TABS
25.0000 mg | ORAL_TABLET | Freq: Four times a day (QID) | ORAL | Status: DC | PRN
  Administered 2020-07-17: 25 mg via ORAL
  Filled 2020-07-17: qty 1

## 2020-07-17 MED ORDER — LISINOPRIL 10 MG PO TABS: 10.0000 mg | ORAL_TABLET | Freq: Every day | ORAL | Status: DC

## 2020-07-17 MED ORDER — IRBESARTAN 300 MG PO TABS
150.0000 mg | ORAL_TABLET | Freq: Every day | ORAL | Status: DC
  Administered 2020-07-17 – 2020-07-18 (×2): 150 mg via ORAL
  Filled 2020-07-17 (×2): qty 1

## 2020-07-17 MED ORDER — MONTELUKAST SODIUM 10 MG PO TABS
10.0000 mg | ORAL_TABLET | Freq: Every day | ORAL | Status: DC
  Administered 2020-07-17 – 2020-07-19 (×3): 10 mg via ORAL
  Filled 2020-07-17 (×3): qty 1

## 2020-07-17 MED ORDER — ACETAMINOPHEN 325 MG PO TABS: 650.0000 mg | ORAL_TABLET | ORAL | Status: DC | PRN

## 2020-07-17 MED ORDER — IBUPROFEN 200 MG PO TABS: 400.0000 mg | ORAL_TABLET | ORAL | Status: DC | PRN

## 2020-07-17 MED ORDER — FUROSEMIDE 10 MG/ML IJ SOLN
20.0000 mg | Freq: Once | INTRAMUSCULAR | Status: AC
  Administered 2020-07-17: 20 mg via INTRAVENOUS
  Filled 2020-07-17: qty 2

## 2020-07-17 MED ORDER — FUROSEMIDE 10 MG/ML IJ SOLN
40.0000 mg | Freq: Two times a day (BID) | INTRAMUSCULAR | Status: DC
  Administered 2020-07-17 – 2020-07-19 (×5): 40 mg via INTRAVENOUS
  Filled 2020-07-17 (×6): qty 4

## 2020-07-17 MED ORDER — ALBUTEROL SULFATE (2.5 MG/3ML) 0.083% IN NEBU: 2.5000 mg | INHALATION_SOLUTION | Freq: Four times a day (QID) | RESPIRATORY_TRACT | Status: DC | PRN

## 2020-07-17 MED ORDER — ALBUTEROL SULFATE (2.5 MG/3ML) 0.083% IN NEBU
2.5000 mg | INHALATION_SOLUTION | Freq: Four times a day (QID) | RESPIRATORY_TRACT | Status: DC
  Administered 2020-07-17: 2.5 mg via RESPIRATORY_TRACT
  Filled 2020-07-17 (×2): qty 3

## 2020-07-17 MED ORDER — FUROSEMIDE 10 MG/ML IJ SOLN: 40.0000 mg | Freq: Two times a day (BID) | INTRAMUSCULAR | Status: DC

## 2020-07-17 NOTE — ED Provider Notes (Signed)
I assumed care of this patient.  Please see previous provider note for further details of Hx, PE.  Briefly patient is a 49 y.o. female who presented for bilateral lower extremity edema with orthopnea and dyspnea on exertion.  Labs notable for elevated BNP.  Chest x-ray and CT of the chest negative for any pulmonary edema.  Currently pending CT of the abdomen to assess for any obstructive mass pressing on the femoral veins/IVC.  CT of the abdomen relatively benign.  Incidental findings were relayed to the patient.  On my reassessment patient had increased work of breathing with very minimal exertion.  Feel that she would benefit from admission to the hospital for Lasix and an echo to assess for likely new onset heart failure.  First dose of IV Lasix given in the emergency department.      Nira Conn, MD 07/17/20 [id number]

## 2020-07-17 NOTE — H&P (Addendum)
Nancy Branch is an 49 y.o. female.    From: Home  Chief Complaint: Shortness of breath, left precordial chest pain, bilateral leg swelling.  HPI: The patient is a 49 yr old woman who states that she has been struggling with respiratory issues for at least a couple of months. She was actually seen in this ED on 05/13/2020 for the same complaints, although she did not have the lower extremity edema at that time. CTA of the chest at that time was negative for PE. She was given a Z-pack. She is being cared for by both cardiology and pulmonology from [facility]. The patient was seen by pulmonology at [facility] on 8/31/202. The patient declined spirometry. She was complaining of cough productive of sputum at that time. She was placed on a steroid taper.   Currently the patient is complaining of 1-2 weeks of continual left precordial chest pain. She states that this is a dull pain that become sharp with deep respirations or cough. She has also had increasing swelling of her lower extremities bilaterally. She states that she has had no appetite in the last couple of weeks. She denies fevers, chills, hematemesis, melena, hematochezia, or coffee ground emesis.   In the ED today the patient has a very high blood pressure. Her troponin is only slightly elevated at 34 and is trending down at 30. BNP was 3260.2. CT of the chest was performed today and demonstrated no cause for the patient's symptoms. CT abdomen and pelvis was also preformed. The liver was found to have hepatic steatosis, there was left colonic diverticulosis, but no diverticulitis. There was also aortic atherosclerosis and a small amount of free fluid in the pelvis. CT abdomen and pelvis was obtained due to concern for venous/lymphatic obstruction of the lower extremities. None was seen.  Past medical history is significant for hypertension and chronic bronchitis. She denies any past cardiac history, diabetes, renal problems, or swelling.  [facility] were consulted to admit the patient for further evaluation and treatment.  History reviewed. No pertinent past medical history.  History reviewed. No pertinent surgical history.  History reviewed. No pertinent family history. Social History:  reports that she has never smoked. She has never used smokeless tobacco. She reports current alcohol use. She reports previous drug use. (Not in a hospital admission)   Allergies:  Allergies  Allergen Reactions  . Robaxin [Methocarbamol] Itching  . Oxycodone     itching    Pertinent items noted in HPI and remainder of comprehensive ROS otherwise negative.  General appearance: alert, cooperative and mild distress Head: Normocephalic, without obvious abnormality, atraumatic Eyes: conjunctivae/corneas clear. PERRL, EOM's intact. Fundi benign. Throat: lips, mucosa, and tongue normal; teeth and gums normal Neck: no adenopathy, no carotid bruit, no JVD, supple, symmetrical, trachea midline, thyroid not enlarged, symmetric, no tenderness/mass/nodules and 13 cm JVD Resp: Positive for increased work of breathing. Positive for scattered rales and rhonchi throughout. No wheezes. Chest wall: no tenderness Cardio: regular rate and rhythm, S1, S2 normal, no murmur, click, rub or gallop GI: soft, non-tender; bowel sounds normal; no masses,  no organomegaly Extremities: edema 4+ and no cyanosis or clubbing Pulses: 2+ and symmetric Skin: Skin color, texture, turgor normal. No rashes or lesions Lymph nodes: Cervical, supraclavicular, and axillary nodes normal. Neurologic: Alert and oriented X 3, normal strength and tone. Normal symmetric reflexes. Normal coordination and gait  Results for orders placed or performed during the hospital encounter of 07/16/20 (from the past 48 hour(s))  Basic  metabolic panel     Status: Abnormal   Collection Time: 07/16/20  7:30 PM  Result Value Ref Range   Sodium 138 135 - 145 mmol/L   Potassium 3.9 3.5 - 5.1  mmol/L   Chloride 106 98 - 111 mmol/L   CO2 21 (L) 22 - 32 mmol/L   Glucose, Bld 112 (H) 70 - 99 mg/dL    Comment: Glucose reference range applies only to samples taken after fasting for at least 8 hours.   BUN 18 6 - 20 mg/dL   Creatinine, Ser 5.88 (H) 0.44 - 1.00 mg/dL   Calcium 8.7 (L) 8.9 - 10.3 mg/dL   GFR calc non Af Amer >60 >60 mL/min   GFR calc Af Amer >60 >60 mL/min   Anion gap 11 5 - 15    Comment: Performed at [facility], [address], [place], [place] [address]  CBC     Status: Abnormal   Collection Time: 07/16/20  7:30 PM  Result Value Ref Range   WBC 8.5 4.0 - 10.5 K/uL   RBC 4.58 3.87 - 5.11 MIL/uL   Hemoglobin 9.4 (L) 12.0 - 15.0 g/dL   HCT 41.2 (L) 36 - 46 %   MCV 69.0 (L) 80.0 - 100.0 fL   MCH 20.5 (L) 26.0 - 34.0 pg   MCHC 29.7 (L) 30.0 - 36.0 g/dL   RDW 87.8 (H) 67.6 - 72.0 %   Platelets 489 (H) 150 - 400 K/uL   nRBC 0.8 (H) 0.0 - 0.2 %    Comment: Performed at [facility], [address], [place], [place] [address]  Troponin I (High Sensitivity)     Status: Abnormal   Collection Time: 07/16/20  7:30 PM  Result Value Ref Range   Troponin I (High Sensitivity) 34 (H) <18 ng/L    Comment: (NOTE) Elevated high sensitivity troponin I (hsTnI) values and significant  changes across serial measurements may suggest ACS but many other  chronic and acute conditions are known to elevate hsTnI results.  Refer to the "Links" section for chest pain algorithms and additional  guidance. Performed at [facility], [address] [place], [place] [address]   Brain natriuretic peptide     Status: Abnormal   Collection Time: 07/16/20  7:30 PM  Result Value Ref Range   B Natriuretic Peptide 3,260.2 (H) 0.0 - 100.0 pg/mL    Comment: Performed at [facility], [address], [place], [place] [address]  I-Stat beta hCG blood, ED     Status: None   Collection Time: 07/16/20  9:09 PM  Result Value Ref Range   I-stat hCG, quantitative <5.0 <5  mIU/mL   Comment 3            Comment:   GEST. AGE      CONC.  (mIU/mL)   <=1 WEEK        5 - 50     2 WEEKS       50 - 500     3 WEEKS       100 - 10,000     4 WEEKS     1,000 - 30,000        FEMALE AND NON-PREGNANT FEMALE:     LESS THAN 5 mIU/mL   Troponin I (High Sensitivity)     Status: Abnormal   Collection Time: 07/16/20 10:26 PM  Result Value Ref Range   Troponin I (High Sensitivity) 30 (H) <  18 ng/L    Comment: (NOTE) Elevated high sensitivity troponin I (hsTnI) values and significant  changes across serial measurements may suggest ACS but many other  chronic and acute conditions are known to elevate hsTnI results.  Refer to the "Links" section for chest pain algorithms and additional  guidance. Performed at [facility], [address] [place], [place] [address]    @[email or web address] @  Blood pressure (!) 173/110, pulse (!) 107, temperature 99.6 F (37.6 C), temperature source Oral, resp. rate 19, height 5\' 5"  (1.651 m), weight 77.1 kg, SpO2 99 %.    Assessment/Plan Problem  Chf (Congestive Heart Failure) (Hcc)  Uncontrolled Hypertension  Chronic Bronchitis (Hcc)  Chest Pain  Peripheral Edema   CHF/Peripheral edema: Elevated BNP with recent onset 3-4+ pitting edema of lower extremities bilaterally. Troponins are slightly elevated at 34, but trending downwards. Possibly a consequence of her uncontrolled hypertension, although she states that her blood pressures are high today because she did not takes her meds today. Echocardiogram is ordered. Heart failure consult has been ordered. They will need to be called in the am. She will be diuresed and I's and O's will be followed closely.   Chest pain: Likely due to bronchitis as opposed to ischemia. Will continue to follow troponins and repeat EKG in the am to rule out MI by EKG and enzyme criteria. The patient will have albuterol available as needed. ED did not feel that CTA chest was necessary as the patient had a similar  presentation on 05/13/2020 with a negative CTA chest. She is not hypoxic and there is no hypotension. She is saturating 100% on room air. I will order a D-dimer and follow up with CTA chest if positive.  Chronic Bronchitis: Nebs and steroids to be continued. There is no increased WBC or fevers. She is saturating 100% on room air.  Uncontrolled Hypertension: Will continue the patient on irbesartan as at home. Will hold norvasc due to concern for CHF.   I have seen and examined this patient myself. I have spent 82 minutes in her evaluation and care.  DVT prophylaxis: Lovenox CODE STATUS: Full Code Family Communication: None available Disposition: The patient will be admitted to a progressive bed as inpatient.  Abou Sterkel 07/17/2020, 1:20 AM

## 2020-07-17 NOTE — Progress Notes (Signed)
PROGRESS NOTE    [name]   ERX:[id number]  DOB: 1970/11/15  DOA: 07/16/2020 PCP: Nancy Canary, PA-C   Brief Narrative:  Nancy Branch is a 49 yr old woman who states that she has been struggling with respiratory issues for at least a couple of months. She states she was treated for a bronchitis last month when she was seen in the ED (05/13/20). CTA of the chest was negative for PE at that time and she was given a Z-pack. She is being cared for by both cardiology and pulmonology from [facility]. The patient was seen by pulmonology at [facility] on 8/31/202. She states her cough has not resolved since being treated for her acute bronchitis and she feels that she has developed dyspnea on exertion. She has also developed severe lower extremity pedal edema and left sided chest pain.   Subjective: Chest pain and cough are slightly better today. Pedal edema continues to be severe.     Assessment & Plan:   Principal Problem:   CHF (congestive heart failure)  - will follow ECHO - cont Lasix and follow I and O - cont Prednisone as well   Active Problems: Chest pain - likely from her cough- added Ibuprofen    Uncontrolled hypertension - Hydralazine, Irbesartan     Time spent in minutes: 35 DVT prophylaxis: Lovenox Code Status: Full code Family Communication:  Disposition Plan:  Status is: Inpatient  Remains inpatient appropriate because:cont IV Lasix   Dispo: The patient is from: Home              Anticipated d/c is to: Home              Anticipated d/c date is: 3 days              Patient currently is not medically stable to d/c.      Consultants:   none Procedures:   none Antimicrobials:  Anti-infectives (From admission, onward)   None       Objective: Vitals:   07/17/20 0830 07/17/20 0900 07/17/20 0930 07/17/20 1004  BP: (!) 146/106 (!) 154/101 (!) 146/92 (!) 131/94  Pulse: 74 (!) 114 (!) 117 (!) 115  Resp: 19 (!) 24  20  Temp:    98.2 F (36.8 C)    TempSrc:    Oral  SpO2: 94% 99% 99% 99%  Weight:    72.8 kg  Height:    5\' 5"  (1.651 m)    Intake/Output Summary (Last 24 hours) at 07/17/2020 1424 Last data filed at 07/17/2020 1047 Gross per 24 hour  Intake 240 ml  Output 200 ml  Net 40 ml   Filed Weights   07/16/20 2219 07/17/20 1004  Weight: 77.1 kg 72.8 kg    Examination: General exam: Appears comfortable  HEENT: PERRLA, oral mucosa moist, no sclera icterus or thrush Respiratory system: Clear to auscultation. Respiratory effort normal. Cardiovascular system: S1 & S2 heard, RRR.   Gastrointestinal system: Abdomen soft, non-tender, nondistended. Normal bowel sounds. Central nervous system: Alert and oriented. No focal neurological deficits. Extremities: No cyanosis, clubbing - 3 + edema Skin: No rashes or ulcers Psychiatry:  Mood & affect appropriate.     Data Reviewed: I have personally reviewed following labs and imaging studies  CBC: Recent Labs  Lab 07/16/20 1930  WBC 8.5  HGB 9.4*  HCT 31.6*  MCV 69.0*  PLT 489*   Basic Metabolic Panel: Recent Labs  Lab 07/16/20 1930  NA 138  K 3.9  CL 106  CO2 21*  GLUCOSE 112*  BUN 18  CREATININE 1.08*  CALCIUM 8.7*   GFR: Estimated Creatinine Clearance: 63 mL/min (A) (by C-G formula based on SCr of 1.08 mg/dL (H)). Liver Function Tests: No results for input(s): AST, ALT, ALKPHOS, BILITOT, PROT, ALBUMIN in the last 168 hours. No results for input(s): LIPASE, AMYLASE in the last 168 hours. No results for input(s): AMMONIA in the last 168 hours. Coagulation Profile: No results for input(s): INR, PROTIME in the last 168 hours. Cardiac Enzymes: No results for input(s): CKTOTAL, CKMB, CKMBINDEX, TROPONINI in the last 168 hours. BNP (last 3 results) No results for input(s): PROBNP in the last 8760 hours. HbA1C: No results for input(s): HGBA1C in the last 72 hours. CBG: No results for input(s): GLUCAP in the last 168 hours. Lipid Profile: No results for  input(s): CHOL, HDL, LDLCALC, TRIG, CHOLHDL, LDLDIRECT in the last 72 hours. Thyroid Function Tests: No results for input(s): TSH, T4TOTAL, FREET4, T3FREE, THYROIDAB in the last 72 hours. Anemia Panel: No results for input(s): VITAMINB12, FOLATE, FERRITIN, TIBC, IRON, RETICCTPCT in the last 72 hours. Urine analysis: No results found for: COLORURINE, APPEARANCEUR, LABSPEC, PHURINE, GLUCOSEU, HGBUR, BILIRUBINUR, KETONESUR, PROTEINUR, UROBILINOGEN, NITRITE, LEUKOCYTESUR Sepsis Labs: @LABRCNTIP (procalcitonin:4,lacticidven:4) ) Recent Results (from the past 240 hour(s))  SARS Coronavirus 2 by RT PCR (Branch order, performed in [facility]) Nasopharyngeal Nasopharyngeal Swab     Status: None   Collection Time: 07/17/20  1:29 AM   Specimen: Nasopharyngeal Swab  Result Value Ref Range Status   SARS Coronavirus 2 NEGATIVE NEGATIVE Final    Comment: (NOTE) SARS-CoV-2 target nucleic acids are NOT DETECTED.  The SARS-CoV-2 RNA is generally detectable in upper and lower respiratory specimens during the acute phase of infection. The lowest concentration of SARS-CoV-2 viral copies this assay can detect is 250 copies / mL. A negative result does not preclude SARS-CoV-2 infection and should not be used as the sole basis for treatment or other patient management decisions.  A negative result may occur with improper specimen collection / handling, submission of specimen other than nasopharyngeal swab, presence of viral mutation(s) within the areas targeted by this assay, and inadequate number of viral copies (<250 copies / mL). A negative result must be combined with clinical observations, patient history, and epidemiological information.  Fact Sheet for Patients:   09/16/20  Fact Sheet for Healthcare Providers: [facility]  This test is not yet approved or  cleared by the https://pope.com/ FDA and has been authorized for  detection and/or diagnosis of SARS-CoV-2 by FDA under an Emergency Use Authorization (EUA).  This EUA will remain in effect (meaning this test can be used) for the duration of the COVID-19 declaration under Section 564(b)(1) of the Act, 21 U.S.C. section 360bbb-3(b)(1), unless the authorization is terminated or revoked sooner.  Performed at [facility], [address] [place], [place]          Radiology Studies: DG Chest 2 View  Result Date: 07/16/2020 CLINICAL DATA:  Chest pain EXAM: CHEST - 2 VIEW COMPARISON:  May 13, 2020 FINDINGS: The heart size and mediastinal contours are within normal limits. Aortic knob calcifications are seen. Both lungs are clear. The visualized skeletal structures are unremarkable. IMPRESSION: No active cardiopulmonary disease. Electronically Signed   By: May 15, 2020 M.D.   On: 07/16/2020 20:10   CT Chest Wo Contrast  Result Date: 07/16/2020 CLINICAL DATA:  Chest pain shortness of breath, left-sided EXAM: CT  CHEST WITHOUT CONTRAST TECHNIQUE: Multidetector CT imaging of the chest was performed following the standard protocol without IV contrast. COMPARISON:  None. FINDINGS: Cardiovascular: Scattered aortic atherosclerosis is seen. There is mild coronary artery calcifications present. The heart size is normal. There is no pericardial thickening or effusion. Mediastinum/Nodes: There are no enlarged mediastinal, hilar or axillary lymph nodes, however limited due to lack of intravenous contrast. The thyroid gland, trachea and esophagus demonstrate no significant findings. Lungs/Pleura: There are no areas consolidation. Minimal biapical scarring is seen. No suspicious pulmonary nodules. There is no pleural effusion. Upper abdomen: The visualized portion of the upper abdomen is unremarkable. Again noted is a heterogeneous 2.4 cm low-density lesion in the right hepatic dome. Musculoskeletal/Chest wall: There is no chest wall mass or suspicious osseous  finding. No acute osseous abnormality IMPRESSION: No acute intrathoracic pathology to explain the patient's symptoms. Electronically Signed   By: Jonna Clark M.D.   On: 07/16/2020 22:53   CT Abdomen Pelvis W Contrast  Result Date: 07/16/2020 CLINICAL DATA:  Abdominal pain EXAM: CT ABDOMEN AND PELVIS WITH CONTRAST TECHNIQUE: Multidetector CT imaging of the abdomen and pelvis was performed using the standard protocol following bolus administration of intravenous contrast. CONTRAST:  OMNIPAQUE IOHEXOL 300 MG/ML  SOLN COMPARISON:  None. FINDINGS: Lower chest: Lung bases are clear. No effusions. Heart is normal size. Hepatobiliary: Liver appears prominent. Diffuse low-density throughout the liver compatible with fatty infiltration. 3 cm cyst in the right hepatic dome. Gallbladder contracted. There appears to be gallbladder wall thickening or pericholecystic fluid. This may be related to liver disease. Pancreas: No focal abnormality or ductal dilatation. Spleen: No focal abnormality.  Normal size. Adrenals/Urinary Tract: No adrenal abnormality. No focal renal abnormality. No stones or hydronephrosis. Urinary bladder is unremarkable. Stomach/Bowel: Left colonic diverticulosis. No active diverticulitis. No evidence of bowel obstruction. Vascular/Lymphatic: Aortic atherosclerosis. No evidence of aneurysm or adenopathy. Reproductive: Heterogeneous uterus, likely fibroids. No adnexal mass. Other: Small amount of free fluid in the pelvis. Musculoskeletal: No acute bony abnormality. IMPRESSION: Liver appears prominent with diffuse hepatic steatosis. Left colonic diverticulosis.  No active diverticulitis. Aortic atherosclerosis. Small amount of free fluid in the pelvis. Electronically Signed   By: Charlett Nose M.D.   On: 07/16/2020 23:33      Scheduled Meds: . aspirin EC  81 mg Oral Daily  . enoxaparin (LOVENOX) injection  40 mg Subcutaneous Daily  . ferrous sulfate  325 mg Oral Q breakfast  . furosemide  40 mg  Intravenous BID AC  . irbesartan  150 mg Oral Daily  . montelukast  10 mg Oral Daily  . [START ON 07/18/2020] pneumococcal 23 valent vaccine  0.5 mL Intramuscular Tomorrow-1000  . predniSONE  40 mg Oral Q breakfast  . sodium chloride flush  3 mL Intravenous Q12H  . zinc sulfate  220 mg Oral Daily   Continuous Infusions: . sodium chloride       LOS: 0 days      Calvert Cantor, MD [facility] Pager: [email or web address] 07/17/2020, 2:24 PM

## 2020-07-17 NOTE — ED Notes (Signed)
SDU Breakfast Ordered 

## 2020-07-17 NOTE — ED Notes (Signed)
Report given.

## 2020-07-17 NOTE — ED Notes (Signed)
Provided pt with breakfast tray

## 2020-07-17 NOTE — Progress Notes (Signed)
  Echocardiogram 2D Echocardiogram has been performed.  Tye Savoy 07/17/2020, 3:47 PM

## 2020-07-18 LAB — BASIC METABOLIC PANEL
Anion gap: 8 (ref 5–15)
BUN: 11 mg/dL (ref 6–20)
CO2: 27 mmol/L (ref 22–32)
Calcium: 8.4 mg/dL — ABNORMAL LOW (ref 8.9–10.3)
Chloride: 101 mmol/L (ref 98–111)
Creatinine, Ser: 1.01 mg/dL — ABNORMAL HIGH (ref 0.44–1.00)
GFR calc Af Amer: >60 mL/min (ref >60)
GFR calc non Af Amer: >60 mL/min (ref >60)
Glucose, Bld: 106 mg/dL — ABNORMAL HIGH (ref 70–99)
Potassium: 3.2 mmol/L — ABNORMAL LOW (ref 3.5–5.1)
Sodium: 136 mmol/L (ref 135–145)

## 2020-07-18 MED ORDER — SACUBITRIL-VALSARTAN 49-51 MG PO TABS
1.0000 | ORAL_TABLET | Freq: Two times a day (BID) | ORAL | Status: DC
  Administered 2020-07-18 – 2020-07-19 (×2): 1 via ORAL
  Filled 2020-07-18 (×2): qty 1

## 2020-07-18 MED ORDER — POTASSIUM CHLORIDE CRYS ER 20 MEQ PO TBCR
40.0000 meq | EXTENDED_RELEASE_TABLET | ORAL | Status: AC
  Administered 2020-07-18 (×2): 40 meq via ORAL
  Filled 2020-07-18 (×2): qty 2

## 2020-07-18 NOTE — Progress Notes (Addendum)
PROGRESS NOTE    Nancy GuessWaeco M Tourville   ZOX:[id number]  DOB: [birth year]  DOA: 07/16/2020 PCP: Nathaneil CanaryPayne, Morgan H, PA-C   Brief Narrative:  Nancy Branch is a 49 yr old woman who states that she has been struggling with respiratory issues for at least a couple of months. She states she was treated for a bronchitis last month when she was seen in the ED (05/13/20). CTA of the chest was negative for PE at that time and she was given a Z-pack. She is being cared for by both cardiology and pulmonology from [facility]. The patient was [facility]. She states her cough has not resolved since being treated for her acute bronchitis and she feels that she has developed dyspnea on exertion. She has also developed severe lower extremity pedal edema and left sided chest pain.   Subjective: Chest pain and cough are slightly better today. Pedal edema continues to be severe.     Assessment & Plan:   Principal Problem:   New diagnosis of Acute systolic CHF (congestive heart failure) - uncontrolled HTN -  ECHO outlined below- EF is 30- 35%- suspect hypertensive cardiomyopathy - cont Lasix and follow I and O - change Cozaar to Entresto - cont Hydralazine - cont Prednisone as well  - symptoms are improving -  Active Problems: Chest pain - likely from her cough- added Ibuprofen        Time spent in minutes: 35 DVT prophylaxis: Lovenox Code Status: Full code Family Communication:  Disposition Plan:  Status is: Inpatient  Remains inpatient appropriate because:cont IV Lasix   Dispo: The patient is from: Home              Anticipated d/c is to: Home              Anticipated d/c date is: 3 days              Patient currently is not medically stable to d/c.      Consultants:   none Procedures:   2 D ECHO 1. Left ventricular ejection fraction, by estimation, is 30 to 35%. The  left ventricle has moderately decreased function. The left ventricle  demonstrates global  hypokinesis. The left ventricular internal cavity size  was mildly dilated. There is mild  left ventricular hypertrophy. Left ventricular diastolic parameters are  consistent with Grade II diastolic dysfunction (pseudonormalization).  Elevated left atrial pressure.  2. Right ventricular systolic function is normal. The right ventricular  size is mildly enlarged. There is mildly elevated pulmonary artery  systolic pressure. The estimated right ventricular systolic pressure is  38.5 mmHg.  3. Left atrial size was moderately dilated.  4. The mitral valve is normal in structure. Mild mitral valve  regurgitation.  5. The aortic valve is tricuspid. Aortic valve regurgitation is not  visualized. No aortic stenosis is present.  6. The inferior vena cava is dilated in size with <50% respiratory  variability, suggesting right atrial pressure of 15 mmHg.  Antimicrobials:  Anti-infectives (From admission, onward)   None       Objective: Vitals:   07/17/20 2008 07/18/20 0052 07/18/20 0446 07/18/20 0724  BP: 130/88 (!) 141/91 (!) 141/94 (!) 137/92  Pulse: (!) 106 (!) 106 (!) 109 (!) 108  Resp: 20 20 20 18   Temp: 98.2 F (36.8 C) 98.3 F (36.8 C) 97.7 F (36.5 C) 98.5 F (36.9 C)  TempSrc: Oral Oral Oral Oral  SpO2: 97% 97% 98% 99%  Weight:  70.9 kg    Height:        Intake/Output Summary (Last 24 hours) at 07/18/2020 0730 Last data filed at 07/18/2020 0446 Gross per 24 hour  Intake 1200 ml  Output 3800 ml  Net -2600 ml   Filed Weights   07/16/20 2219 07/17/20 1004 07/18/20 0052  Weight: 77.1 kg 72.8 kg 70.9 kg    Examination: General exam: Appears comfortable  HEENT: PERRLA, oral mucosa moist, no sclera icterus or thrush Respiratory system: Clear to auscultation. Respiratory effort normal. Cardiovascular system: S1 & S2 heard,  No murmurs  Gastrointestinal system: Abdomen soft, non-tender, nondistended. Normal bowel sounds   Central nervous system: Alert and oriented. No  focal neurological deficits. Extremities: No cyanosis, clubbing 2+ pitting edema Skin: No rashes or ulcers Psychiatry:  Mood & affect appropriate.     Data Reviewed: I have personally reviewed following labs and imaging studies  CBC: Recent Labs  Lab 07/16/20 1930  WBC 8.5  HGB 9.4*  HCT 31.6*  MCV 69.0*  PLT 489*   Basic Metabolic Panel: Recent Labs  Lab 07/16/20 1930 07/18/20 0430  NA 138 136  K 3.9 3.2*  CL 106 101  CO2 21* 27  GLUCOSE 112* 106*  BUN 18 11  CREATININE 1.08* 1.01*  CALCIUM 8.7* 8.4*   GFR: Estimated Creatinine Clearance: 66.6 mL/min (A) (by C-G formula based on SCr of 1.01 mg/dL (H)). Liver Function Tests: No results for input(s): AST, ALT, ALKPHOS, BILITOT, PROT, ALBUMIN in the last 168 hours. No results for input(s): LIPASE, AMYLASE in the last 168 hours. No results for input(s): AMMONIA in the last 168 hours. Coagulation Profile: No results for input(s): INR, PROTIME in the last 168 hours. Cardiac Enzymes: No results for input(s): CKTOTAL, CKMB, CKMBINDEX, TROPONINI in the last 168 hours. BNP (last 3 results) No results for input(s): PROBNP in the last 8760 hours. HbA1C: No results for input(s): HGBA1C in the last 72 hours. CBG: No results for input(s): GLUCAP in the last 168 hours. Lipid Profile: No results for input(s): CHOL, HDL, LDLCALC, TRIG, CHOLHDL, LDLDIRECT in the last 72 hours. Thyroid Function Tests: No results for input(s): TSH, T4TOTAL, FREET4, T3FREE, THYROIDAB in the last 72 hours. Anemia Panel: No results for input(s): VITAMINB12, FOLATE, FERRITIN, TIBC, IRON, RETICCTPCT in the last 72 hours. Urine analysis: No results found for: COLORURINE, APPEARANCEUR, LABSPEC, PHURINE, GLUCOSEU, HGBUR, BILIRUBINUR, KETONESUR, PROTEINUR, UROBILINOGEN, NITRITE, LEUKOCYTESUR Sepsis Labs: @LABRCNTIP (procalcitonin:4,lacticidven:4) ) Recent Results (from the past 240 hour(s))  SARS Coronavirus 2 by RT PCR (hospital order, performed in  [facility] hospital lab) Nasopharyngeal Nasopharyngeal Swab     Status: None   Collection Time: 07/17/20  1:29 AM   Specimen: Nasopharyngeal Swab  Result Value Ref Range Status   SARS Coronavirus 2 NEGATIVE NEGATIVE Final    Comment: (NOTE) SARS-CoV-2 target nucleic acids are NOT DETECTED.  The SARS-CoV-2 RNA is generally detectable in upper and lower respiratory specimens during the acute phase of infection. The lowest concentration of SARS-CoV-2 viral copies this assay can detect is 250 copies / mL. A negative result does not preclude SARS-CoV-2 infection and should not be used as the sole basis for treatment or other patient management decisions.  A negative result may occur with improper specimen collection / handling, submission of specimen other than nasopharyngeal swab, presence of viral mutation(s) within the areas targeted by this assay, and inadequate number of viral copies (<250 copies / mL). A negative result must be combined with clinical observations, patient  history, and epidemiological information.  Fact Sheet for Patients:   BoilerBrush.com.cy  Fact Sheet for Healthcare Providers: https://pope.com/  This test is not yet approved or  cleared by the [place] FDA and has been authorized for detection and/or diagnosis of SARS-CoV-2 by FDA under an Emergency Use Authorization (EUA).  This EUA will remain in effect (meaning this test can be used) for the duration of the COVID-19 declaration under Section 564(b)(1) of the Act, 21 U.S.C. section 360bbb-3(b)(1), unless the authorization is terminated or revoked sooner.  Performed at [facility], [address], [place], [place] [address]          Radiology Studies: DG Chest 2 View  Result Date: 07/16/2020 CLINICAL DATA:  Chest pain EXAM: CHEST - 2 VIEW COMPARISON:  May 13, 2020 FINDINGS: The heart size and mediastinal contours are within normal limits.  Aortic knob calcifications are seen. Both lungs are clear. The visualized skeletal structures are unremarkable. IMPRESSION: No active cardiopulmonary disease. Electronically Signed   By: Jonna Clark M.D.   On: 07/16/2020 20:10   CT Chest Wo Contrast  Result Date: 07/16/2020 CLINICAL DATA:  Chest pain shortness of breath, left-sided EXAM: CT CHEST WITHOUT CONTRAST TECHNIQUE: Multidetector CT imaging of the chest was performed following the standard protocol without IV contrast. COMPARISON:  None. FINDINGS: Cardiovascular: Scattered aortic atherosclerosis is seen. There is mild coronary artery calcifications present. The heart size is normal. There is no pericardial thickening or effusion. Mediastinum/Nodes: There are no enlarged mediastinal, hilar or axillary lymph nodes, however limited due to lack of intravenous contrast. The thyroid gland, trachea and esophagus demonstrate no significant findings. Lungs/Pleura: There are no areas consolidation. Minimal biapical scarring is seen. No suspicious pulmonary nodules. There is no pleural effusion. Upper abdomen: The visualized portion of the upper abdomen is unremarkable. Again noted is a heterogeneous 2.4 cm low-density lesion in the right hepatic dome. Musculoskeletal/Chest wall: There is no chest wall mass or suspicious osseous finding. No acute osseous abnormality IMPRESSION: No acute intrathoracic pathology to explain the patient's symptoms. Electronically Signed   By: Jonna Clark M.D.   On: 07/16/2020 22:53   CT Abdomen Pelvis W Contrast  Result Date: 07/16/2020 CLINICAL DATA:  Abdominal pain EXAM: CT ABDOMEN AND PELVIS WITH CONTRAST TECHNIQUE: Multidetector CT imaging of the abdomen and pelvis was performed using the standard protocol following bolus administration of intravenous contrast. CONTRAST:  OMNIPAQUE IOHEXOL 300 MG/ML  SOLN COMPARISON:  None. FINDINGS: Lower chest: Lung bases are clear. No effusions. Heart is normal size. Hepatobiliary:  Liver appears prominent. Diffuse low-density throughout the liver compatible with fatty infiltration. 3 cm cyst in the right hepatic dome. Gallbladder contracted. There appears to be gallbladder wall thickening or pericholecystic fluid. This may be related to liver disease. Pancreas: No focal abnormality or ductal dilatation. Spleen: No focal abnormality.  Normal size. Adrenals/Urinary Tract: No adrenal abnormality. No focal renal abnormality. No stones or hydronephrosis. Urinary bladder is unremarkable. Stomach/Bowel: Left colonic diverticulosis. No active diverticulitis. No evidence of bowel obstruction. Vascular/Lymphatic: Aortic atherosclerosis. No evidence of aneurysm or adenopathy. Reproductive: Heterogeneous uterus, likely fibroids. No adnexal mass. Other: Small amount of free fluid in the pelvis. Musculoskeletal: No acute bony abnormality. IMPRESSION: Liver appears prominent with diffuse hepatic steatosis. Left colonic diverticulosis.  No active diverticulitis. Aortic atherosclerosis. Small amount of free fluid in the pelvis. Electronically Signed   By: Charlett Nose M.D.   On: 07/16/2020 23:33   ECHOCARDIOGRAM COMPLETE  Result Date: 07/17/2020  ECHOCARDIOGRAM REPORT   Patient Name:   Nancy Branch Date of Exam: 07/17/2020 Medical Rec #:  [id number]      Height:       65.0 in Accession #:    [id number]     Weight:       160.4 lb Date of Birth:  08/02/1971      BSA:          1.801 m Patient Age:    48 years       BP:           131/94 mmHg Patient Gender: F              HR:           104 bpm. Exam Location:  Inpatient Procedure: 2D Echo Indications:    CHF- Acute Systolic I50.21  History:        Patient has no prior history of Echocardiogram examinations.                 Signs/Symptoms:Chest Pain; Risk Factors:Hypertension.  Sonographer:    Thurman Coyer RDCS (AE) Referring Phys: [id number] AVA SWAYZE IMPRESSIONS  1. Left ventricular ejection fraction, by estimation, is 30 to 35%. The left ventricle has  moderately decreased function. The left ventricle demonstrates global hypokinesis. The left ventricular internal cavity size was mildly dilated. There is mild left ventricular hypertrophy. Left ventricular diastolic parameters are consistent with Grade II diastolic dysfunction (pseudonormalization). Elevated left atrial pressure.  2. Right ventricular systolic function is normal. The right ventricular size is mildly enlarged. There is mildly elevated pulmonary artery systolic pressure. The estimated right ventricular systolic pressure is 38.5 mmHg.  3. Left atrial size was moderately dilated.  4. The mitral valve is normal in structure. Mild mitral valve regurgitation.  5. The aortic valve is tricuspid. Aortic valve regurgitation is not visualized. No aortic stenosis is present.  6. The inferior vena cava is dilated in size with <50% respiratory variability, suggesting right atrial pressure of 15 mmHg. FINDINGS  Left Ventricle: Left ventricular ejection fraction, by estimation, is 30 to 35%. The left ventricle has moderately decreased function. The left ventricle demonstrates global hypokinesis. The left ventricular internal cavity size was mildly dilated. There is mild left ventricular hypertrophy. Left ventricular diastolic parameters are consistent with Grade II diastolic dysfunction (pseudonormalization). Elevated left atrial pressure. Right Ventricle: The right ventricular size is mildly enlarged. No increase in right ventricular wall thickness. Right ventricular systolic function is normal. There is mildly elevated pulmonary artery systolic pressure. The tricuspid regurgitant velocity is 2.76 m/s, and with an assumed right atrial pressure of 8 mmHg, the estimated right ventricular systolic pressure is 38.5 mmHg. Left Atrium: Left atrial size was moderately dilated. Right Atrium: Right atrial size was normal in size. Pericardium: There is no evidence of pericardial effusion. Mitral Valve: The mitral valve is  normal in structure. Mild mitral valve regurgitation. Tricuspid Valve: The tricuspid valve is normal in structure. Tricuspid valve regurgitation is mild. Aortic Valve: The aortic valve is tricuspid. Aortic valve regurgitation is not visualized. No aortic stenosis is present. Pulmonic Valve: The pulmonic valve was not well visualized. Pulmonic valve regurgitation is not visualized. Aorta: The aortic root and ascending aorta are structurally normal, with no evidence of dilitation. Venous: The inferior vena cava is dilated in size with less than 50% respiratory variability, suggesting right atrial pressure of 15 mmHg. IAS/Shunts: The interatrial septum was not well visualized.  LEFT VENTRICLE PLAX 2D LVIDd:  5.30 cm  Diastology LVIDs:         4.20 cm  LV e' lateral:   8.16 cm/s LV PW:         1.00 cm  LV E/e' lateral: 9.2 LV IVS:        0.90 cm  LV e' medial:    6.85 cm/s LVOT diam:     2.00 cm  LV E/e' medial:  10.9 LV SV:         55 LV SV Index:   31 LVOT Area:     3.14 cm  RIGHT VENTRICLE RV S prime:     14.80 cm/s TAPSE (M-mode): 2.1 cm LEFT ATRIUM             Index       RIGHT ATRIUM           Index LA diam:        3.60 cm 2.00 cm/m  RA Area:     14.20 cm LA Vol (A2C):   74.2 ml 41.20 ml/m RA Volume:   29.80 ml  16.55 ml/m LA Vol (A4C):   96.1 ml 53.36 ml/m LA Biplane Vol: 84.2 ml 46.75 ml/m  AORTIC VALVE LVOT Vmax:   93.30 cm/s LVOT Vmean:  63.900 cm/s LVOT VTI:    0.175 m  AORTA Ao Root diam: 3.10 cm Ao Asc diam:  2.80 cm MITRAL VALVE               TRICUSPID VALVE MV Area (PHT): 3.77 cm    TR Peak grad:   30.5 mmHg MV Decel Time: 201 msec    TR Vmax:        276.00 cm/s MV E velocity: 75.00 cm/s MV A velocity: 75.40 cm/s  SHUNTS MV E/A ratio:  0.99        Systemic VTI:  0.18 m                            Systemic Diam: 2.00 cm Epifanio Lesches MD Electronically signed by Epifanio Lesches MD Signature Date/Time: 07/17/2020/5:18:02 PM    Final       Scheduled Meds: . aspirin EC  81 mg Oral  Daily  . enoxaparin (LOVENOX) injection  40 mg Subcutaneous Daily  . ferrous sulfate  325 mg Oral Q breakfast  . furosemide  40 mg Intravenous BID AC  . irbesartan  150 mg Oral Daily  . montelukast  10 mg Oral Daily  . pneumococcal 23 valent vaccine  0.5 mL Intramuscular Tomorrow-1000  . predniSONE  40 mg Oral Q breakfast  . sodium chloride flush  3 mL Intravenous Q12H  . zinc sulfate  220 mg Oral Daily   Continuous Infusions: . sodium chloride       LOS: 1 day      Calvert Cantor, MD [facility] Pager: [email or web address] 07/18/2020, 7:30 AM

## 2020-07-18 NOTE — Progress Notes (Signed)
Patient walked the hall with RN. Patient had no signs or complaints of SOB , no O2 needed. Patient states she is feeling better despite swelling.   RN will continue to monitor.

## 2020-07-19 DIAGNOSIS — I5020 Unspecified systolic (congestive) heart failure: Secondary | ICD-10-CM

## 2020-07-19 DIAGNOSIS — R0601 Orthopnea: Secondary | ICD-10-CM

## 2020-07-19 DIAGNOSIS — I5041 Acute combined systolic (congestive) and diastolic (congestive) heart failure: Secondary | ICD-10-CM

## 2020-07-19 LAB — BASIC METABOLIC PANEL
Anion gap: 8 (ref 5–15)
BUN: 8 mg/dL (ref 6–20)
CO2: 26 mmol/L (ref 22–32)
Calcium: 8.9 mg/dL (ref 8.9–10.3)
Chloride: 104 mmol/L (ref 98–111)
Creatinine, Ser: 0.84 mg/dL (ref 0.44–1.00)
GFR calc Af Amer: >60 mL/min (ref >60)
GFR calc non Af Amer: >60 mL/min (ref >60)
Glucose, Bld: 117 mg/dL — ABNORMAL HIGH (ref 70–99)
Potassium: 3.9 mmol/L (ref 3.5–5.1)
Sodium: 138 mmol/L (ref 135–145)

## 2020-07-19 MED ORDER — ASPIRIN 81 MG PO TBEC
81.0000 mg | DELAYED_RELEASE_TABLET | Freq: Every day | ORAL | 11 refills | Status: DC
Start: 2020-07-20 — End: 2020-08-31

## 2020-07-19 MED ORDER — FUROSEMIDE 40 MG PO TABS: 40.0000 mg | ORAL_TABLET | Freq: Every day | ORAL | 0 refills | Status: DC

## 2020-07-19 MED ORDER — POTASSIUM CHLORIDE ER 10 MEQ PO TBCR: 10.0000 meq | EXTENDED_RELEASE_TABLET | Freq: Every day | ORAL | 0 refills | Status: DC

## 2020-07-19 MED ORDER — CARVEDILOL 3.125 MG PO TABS
3.1250 mg | ORAL_TABLET | Freq: Two times a day (BID) | ORAL | 0 refills | Status: DC
Start: 2020-07-19 — End: 2020-07-19

## 2020-07-19 MED ORDER — SACUBITRIL-VALSARTAN 49-51 MG PO TABS: 1.0000 | ORAL_TABLET | Freq: Two times a day (BID) | ORAL | 0 refills | Status: DC

## 2020-07-19 MED ORDER — T.E.D. BELOW KNEE/M-REGULAR MISC: 1.0000 | Freq: Every day | 0 refills | Status: AC

## 2020-07-19 MED ORDER — CARVEDILOL 3.125 MG PO TABS: 3.1250 mg | ORAL_TABLET | Freq: Two times a day (BID) | ORAL | Status: DC

## 2020-07-19 MED ORDER — CARVEDILOL 3.125 MG PO TABS
3.1250 mg | ORAL_TABLET | Freq: Two times a day (BID) | ORAL | 0 refills | Status: DC
Start: 2020-07-19 — End: 2020-08-31

## 2020-07-19 NOTE — Plan of Care (Signed)

## 2020-07-19 NOTE — Discharge Summary (Addendum)
Physician Discharge Summary  Nancy Branch XBJ:[id number] DOB: 1971/07/02 DOA: 07/16/2020  PCP: Nathaneil Canary, PA-C  Admit date: 07/16/2020 Discharge date: 07/19/2020  Admitted From: home Disposition:  home   Recommendations for Outpatient Follow-up:  1. Please repeat ECHO in 3 months 2. She needs a Bmet in 1 wk to follow up on electrolytes due to new start on Entresto and Lasix 3. Please titrate dose of Coreg and Lasix at follow up visit.   Home Health:  none  Discharge Condition:  stable   CODE STATUS:  Full code   Diet recommendation:  Hearth healthy Consultations:  noine  Procedures/Studies: . 2 D ECHO 1. Left ventricular ejection fraction, by estimation, is 30 to 35%. The  left ventricle has moderately decreased function. The left ventricle  demonstrates global hypokinesis. The left ventricular internal cavity size  was mildly dilated. There is mild  left ventricular hypertrophy. Left ventricular diastolic parameters are  consistent with Grade II diastolic dysfunction (pseudonormalization).  Elevated left atrial pressure.  2. Right ventricular systolic function is normal. The right ventricular  size is mildly enlarged. There is mildly elevated pulmonary artery  systolic pressure. The estimated right ventricular systolic pressure is  38.5 mmHg.  3. Left atrial size was moderately dilated.  4. The mitral valve is normal in structure. Mild mitral valve  regurgitation.  5. The aortic valve is tricuspid. Aortic valve regurgitation is not  visualized. No aortic stenosis is present.  6. The inferior vena cava is dilated in size with <50% respiratory  variability, suggesting right atrial pressure of 15 mmHg.   Discharge Diagnoses:  Principal Problem:   Acute combined systolic and diastolic congestive heart failure (HCC) Active Problems:   Uncontrolled hypertension   Chest pain   Peripheral edema   Brief Summary: Nancy Branch is a 49 yr old woman who states that  she has been struggling with respiratory issues for at least a couple of months. She states she was treated for a bronchitis last month when she was seen in the ED (05/13/20). CTA of the chest was negative for PE at that time and she was given a Z-pack. She is being cared for by both cardiology and pulmonology from [facility]. The patient was seen by pulmonology at [facility] on 8/31/202. She states her cough has not resolved since being treated for her acute bronchitis and she feels that she has developed dyspnea on exertion. She has also developed severe lower extremity pedal edema and left sided chest pain.   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 of unrecognized or uncontrolled BP - treated with IV Lasix - her weight was measured to be at 170 lb on admission and is now 151 lb - I did change her Cozaar to Entresto - BP is now well controlled in the hospital and this AM was 123/89 - symptoms have improved - she has received education regarding her new diagnosis of CHF from myself and the nursing staff and needs close follow up- I have requested an outpatient cardiology appointment (with Dr Rosemary Holms who is on call today) for her to follow up on this   Active Problems: Cough with Chest pain - she  states she was treated for an acute bronchitis but her cough did not resolve- she was placed on Prednisone when she came into the hospital - Although the residual dry cough cough be lingering from a viral bronchitis, I feel it was also secondary to CHF- it is improving - her left sided chest pain is likely from her cough- Troponin were only minimally elevated in the 30s- added Ibuprofen PRN for this pain  which has also improved  Hypokalemia - related to Lasix and has been adequately replaced  Pedal edema - partly due to her low EF but I also suspect a venous stasis component- it also appears that she was previously on Amlodipine which has been discontinued - she spend most of her work day up on her feet - I will prescribe TEDS    Discharge Exam: Vitals:   07/19/20 0144 07/19/20 0420  BP: 136/88 123/89  Pulse: 93 99  Resp: 19 19  Temp: 97.9 F (36.6 C) 97.7 F (36.5 C)  SpO2: 98% 100%   Vitals:   07/18/20 1608 07/18/20 1956 07/19/20 0144 07/19/20 0420  BP: (!) 139/95 (!) 135/91 136/88 123/89  Pulse: (!) 106 (!) 105 93 99  Resp: Temp: 98.5 F (36.9 C) 98.2 F (36.8 C) 97.9 F (36.6 C) 97.7 F (36.5 C)  TempSrc: Oral Oral Oral Oral  SpO2: 100% 99% 98% 100%  Weight:   68.7 kg   Height:        General: Pt is alert, awake, not in acute distress Cardiovascular: RRR, S1/S2 +, no rubs, no gallops Respiratory: CTA bilaterally, no wheezing, no rhonchi Abdominal: Soft, NT, ND, bowel sounds + Extremities: no edema, no cyanosis   Discharge Instructions  Discharge Instructions    Diet - low sodium heart healthy   Complete by: As directed      Allergies as of 07/19/2020      Reactions   Oxycodone    itching      Medication List    STOP taking these medications   meloxicam 15 MG tablet Commonly known as: MOBIC   telmisartan 40 MG tablet Commonly known as: MICARDIS     TAKE these medications   albuterol 108 (90 Base) MCG/ACT inhaler Commonly known as: VENTOLIN HFA Inhale 2 puffs into the lungs every 6 (six) hours as needed for wheezing or shortness of breath.   aspirin 81 MG EC tablet Take 1 tablet (81 mg total) by mouth daily. Swallow whole. Start taking on: July 20, 2020   cetirizine 10 MG tablet Commonly known as: ZYRTEC Take 10 mg by mouth daily as needed for allergies.   ELDERBERRY PO Take 1 tablet by mouth daily.   ferrous  sulfate 325 (65 FE) MG tablet Take 325 mg by mouth daily with breakfast.   fluticasone 50 MCG/ACT nasal spray Commonly known as: FLONASE Place 2 sprays into both nostrils daily as needed.   furosemide 40 MG tablet Commonly known as: Lasix Take 1 tablet (40 mg total) by mouth daily.   montelukast 10 MG tablet Commonly known as: SINGULAIR Take 10 mg by mouth daily.   potassium chloride 10 MEQ tablet Commonly known as: KLOR-CON Take 1 tablet (10 mEq total) by mouth daily.   sacubitril-valsartan 49-51 MG Commonly known as: ENTRESTO Take 1 tablet by mouth 2 (two) times daily.   T.E.D. Below Knee/M-Regular Misc 1 each by Does not apply route daily.   triamcinolone cream 0.1 % Commonly known as: KENALOG Apply 1  application topically 2 (two) times daily.   vitamin B-12 500 MCG tablet Commonly known as: CYANOCOBALAMIN Take 500 mcg by mouth daily.   VITAMIN D PO Take 1 tablet by mouth daily.   Zinc Gluconate 15 MG Tabs Take 15 mg by mouth daily.       Follow-up Information    Nathaneil Canary, PA-C. Schedule an appointment as soon as possible for a visit in 1 week(s).   Specialty: Physician Assistant Contact information: [address] [place] [address] [phone]        Elder Negus, MD. Schedule an appointment as soon as possible for a visit on 07/29/2020.   Specialties: Cardiology, Radiology Why: 1:00 PM Contact information: [address] [place] [address] [phone]              Allergies  Allergen Reactions  . Oxycodone     itching      DG Chest 2 View  Result Date: 07/16/2020 CLINICAL DATA:  Chest pain EXAM: CHEST - 2 VIEW COMPARISON:  May 13, 2020 FINDINGS: The heart size and mediastinal contours are within normal limits. Aortic knob calcifications are seen. Both lungs are clear. The visualized skeletal structures are unremarkable. IMPRESSION: No active cardiopulmonary disease. Electronically  Signed   By: Jonna Clark M.D.   On: 07/16/2020 20:10   CT Chest Wo Contrast  Result Date: 07/16/2020 CLINICAL DATA:  Chest pain shortness of breath, left-sided EXAM: CT CHEST WITHOUT CONTRAST TECHNIQUE: Multidetector CT imaging of the chest was performed following the standard protocol without IV contrast. COMPARISON:  None. FINDINGS: Cardiovascular: Scattered aortic atherosclerosis is seen. There is mild coronary artery calcifications present. The heart size is normal. There is no pericardial thickening or effusion. Mediastinum/Nodes: There are no enlarged mediastinal, hilar or axillary lymph nodes, however limited due to lack of intravenous contrast. The thyroid gland, trachea and esophagus demonstrate no significant findings. Lungs/Pleura: There are no areas consolidation. Minimal biapical scarring is seen. No suspicious pulmonary nodules. There is no pleural effusion. Upper abdomen: The visualized portion of the upper abdomen is unremarkable. Again noted is a heterogeneous 2.4 cm low-density lesion in the right hepatic dome. Musculoskeletal/Chest wall: There is no chest wall mass or suspicious osseous finding. No acute osseous abnormality IMPRESSION: No acute intrathoracic pathology to explain the patient's symptoms. Electronically Signed   By: Jonna Clark M.D.   On: 07/16/2020 22:53   CT Abdomen Pelvis W Contrast  Result Date: 07/16/2020 CLINICAL DATA:  Abdominal pain EXAM: CT ABDOMEN AND PELVIS WITH CONTRAST TECHNIQUE: Multidetector CT imaging of the abdomen and pelvis was performed using the standard protocol following bolus administration of intravenous contrast. CONTRAST:  OMNIPAQUE IOHEXOL 300 MG/ML  SOLN COMPARISON:  None. FINDINGS: Lower chest: Lung bases are clear. No effusions. Heart is normal size. Hepatobiliary: Liver appears prominent. Diffuse low-density throughout the liver compatible with fatty infiltration. 3 cm cyst in the right hepatic dome. Gallbladder contracted. There appears  to be gallbladder wall thickening or pericholecystic fluid. This may be related to liver disease. Pancreas: No focal abnormality or ductal dilatation. Spleen: No focal abnormality.  Normal size. Adrenals/Urinary Tract: No adrenal abnormality. No focal renal abnormality. No stones or hydronephrosis. Urinary bladder is unremarkable. Stomach/Bowel: Left colonic diverticulosis. No active diverticulitis. No evidence of bowel obstruction. Vascular/Lymphatic: Aortic atherosclerosis. No evidence of aneurysm or adenopathy. Reproductive: Heterogeneous uterus, likely fibroids. No adnexal mass. Other: Small amount of free fluid in the pelvis. Musculoskeletal: No acute bony  abnormality. IMPRESSION: Liver appears prominent with diffuse hepatic steatosis. Left colonic diverticulosis.  No active diverticulitis. Aortic atherosclerosis. Small amount of free fluid in the pelvis. Electronically Signed   By: Charlett Nose M.D.   On: 07/16/2020 23:33   ECHOCARDIOGRAM COMPLETE  Result Date: 07/17/2020    ECHOCARDIOGRAM REPORT   Patient Name:   Nancy Branch Date of Exam: 07/17/2020 Medical Rec #:  [id number]      Height:       65.0 in Accession #:    [id number]     Weight:       160.4 lb Date of Birth:  01-14-1971      BSA:          1.801 m Patient Age:    48 years       BP:           131/94 mmHg Patient Gender: F              HR:           104 bpm. Exam Location:  Inpatient Procedure: 2D Echo Indications:    CHF- Acute Systolic I50.21  History:        Patient has no prior history of Echocardiogram examinations.                 Signs/Symptoms:Chest Pain; Risk Factors:Hypertension.  Sonographer:    Thurman Coyer RDCS (AE) Referring Phys: [id number] AVA SWAYZE IMPRESSIONS  1. Left ventricular ejection fraction, by estimation, is 30 to 35%. The left ventricle has moderately decreased function. The left ventricle demonstrates global hypokinesis. The left ventricular internal cavity size was mildly dilated. There is mild left ventricular  hypertrophy. Left ventricular diastolic parameters are consistent with Grade II diastolic dysfunction (pseudonormalization). Elevated left atrial pressure.  2. Right ventricular systolic function is normal. The right ventricular size is mildly enlarged. There is mildly elevated pulmonary artery systolic pressure. The estimated right ventricular systolic pressure is 38.5 mmHg.  3. Left atrial size was moderately dilated.  4. The mitral valve is normal in structure. Mild mitral valve regurgitation.  5. The aortic valve is tricuspid. Aortic valve regurgitation is not visualized. No aortic stenosis is present.  6. The inferior vena cava is dilated in size with <50% respiratory variability, suggesting right atrial pressure of 15 mmHg. FINDINGS  Left Ventricle: Left ventricular ejection fraction, by estimation, is 30 to 35%. The left ventricle has moderately decreased function. The left ventricle demonstrates global hypokinesis. The left ventricular internal cavity size was mildly dilated. There is mild left ventricular hypertrophy. Left ventricular diastolic parameters are consistent with Grade II diastolic dysfunction (pseudonormalization). Elevated left atrial pressure. Right Ventricle: The right ventricular size is mildly enlarged. No increase in right ventricular wall thickness. Right ventricular systolic function is normal. There is mildly elevated pulmonary artery systolic pressure. The tricuspid regurgitant velocity is 2.76 m/s, and with an assumed right atrial pressure of 8 mmHg, the estimated right ventricular systolic pressure is 38.5 mmHg. Left Atrium: Left atrial size was moderately dilated. Right Atrium: Right atrial size was normal in size. Pericardium: There is no evidence of pericardial effusion. Mitral Valve: The mitral valve is normal in structure. Mild mitral valve regurgitation. Tricuspid Valve: The tricuspid valve is normal in structure. Tricuspid valve regurgitation is mild. Aortic Valve: The aortic  valve is tricuspid. Aortic valve regurgitation is not visualized. No aortic stenosis is present. Pulmonic Valve: The pulmonic valve was not well visualized. Pulmonic valve regurgitation is not visualized. Aorta: The aortic  root and ascending aorta are structurally normal, with no evidence of dilitation. Venous: The inferior vena cava is dilated in size with less than 50% respiratory variability, suggesting right atrial pressure of 15 mmHg. IAS/Shunts: The interatrial septum was not well visualized.  LEFT VENTRICLE PLAX 2D LVIDd:         5.30 cm  Diastology LVIDs:         4.20 cm  LV e' lateral:   8.16 cm/s LV PW:         1.00 cm  LV E/e' lateral: 9.2 LV IVS:        0.90 cm  LV e' medial:    6.85 cm/s LVOT diam:     2.00 cm  LV E/e' medial:  10.9 LV SV:         55 LV SV Index:   31 LVOT Area:     3.14 cm  RIGHT VENTRICLE RV S prime:     14.80 cm/s TAPSE (M-mode): 2.1 cm LEFT ATRIUM             Index       RIGHT ATRIUM           Index LA diam:        3.60 cm 2.00 cm/m  RA Area:     14.20 cm LA Vol (A2C):   74.2 ml 41.20 ml/m RA Volume:   29.80 ml  16.55 ml/m LA Vol (A4C):   96.1 ml 53.36 ml/m LA Biplane Vol: 84.2 ml 46.75 ml/m  AORTIC VALVE LVOT Vmax:   93.30 cm/s LVOT Vmean:  63.900 cm/s LVOT VTI:    0.175 m  AORTA Ao Root diam: 3.10 cm Ao Asc diam:  2.80 cm MITRAL VALVE               TRICUSPID VALVE MV Area (PHT): 3.77 cm    TR Peak grad:   30.5 mmHg MV Decel Time: 201 msec    TR Vmax:        276.00 cm/s MV E velocity: 75.00 cm/s MV A velocity: 75.40 cm/s  SHUNTS MV E/A ratio:  0.99        Systemic VTI:  0.18 m                            Systemic Diam: 2.00 cm Epifanio Lesches MD Electronically signed by Epifanio Lesches MD Signature Date/Time: 07/17/2020/5:18:02 PM    Final      The results of significant diagnostics from this hospitalization (including imaging, microbiology, ancillary and laboratory) are listed below for reference.     Microbiology: Recent Results (from the past 240 hour(s))   SARS Coronavirus 2 by RT PCR (hospital order, performed in [facility] hospital lab) Nasopharyngeal Nasopharyngeal Swab     Status: None   Collection Time: 07/17/20  1:29 AM   Specimen: Nasopharyngeal Swab  Result Value Ref Range Status   SARS Coronavirus 2 NEGATIVE NEGATIVE Final    Comment: (NOTE) SARS-CoV-2 target nucleic acids are NOT DETECTED.  The SARS-CoV-2 RNA is generally detectable in upper and lower respiratory specimens during the acute phase of infection. The lowest concentration of SARS-CoV-2 viral copies this assay can detect is 250 copies / mL. A negative result does not preclude SARS-CoV-2 infection and should not be used as the sole basis for treatment or other patient management decisions.  A negative result may occur with improper specimen collection / handling, submission of specimen other than  nasopharyngeal swab, presence of viral mutation(s) within the areas targeted by this assay, and inadequate number of viral copies (<250 copies / mL). A negative result must be combined with clinical observations, patient history, and epidemiological information.  Fact Sheet for Patients:   BoilerBrush.com.cy  Fact Sheet for Healthcare Providers: https://pope.com/  This test is not yet approved or  cleared by the [place] FDA and has been authorized for detection and/or diagnosis of SARS-CoV-2 by FDA under an Emergency Use Authorization (EUA).  This EUA will remain in effect (meaning this test can be used) for the duration of the COVID-19 declaration under Section 564(b)(1) of the Act, 21 U.S.C. section 360bbb-3(b)(1), unless the authorization is terminated or revoked sooner.  Performed at [facility], [address] [place], [place] [address]      Labs: BNP (last 3 results) Recent Labs    07/16/20 1930  BNP 3,260.2*   Basic Metabolic Panel: Recent Labs  Lab 07/16/20 1930 07/18/20 0430  07/19/20 0736  NA 138 136 138  K 3.9 3.2* 3.9  CL 106 101 104  CO2 21* 27 26  GLUCOSE 112* 106* 117*  BUN CREATININE 1.08* 1.01* 0.84  CALCIUM 8.7* 8.4* 8.9   Liver Function Tests: No results for input(s): AST, ALT, ALKPHOS, BILITOT, PROT, ALBUMIN in the last 168 hours. No results for input(s): LIPASE, AMYLASE in the last 168 hours. No results for input(s): AMMONIA in the last 168 hours. CBC: Recent Labs  Lab 07/16/20 1930  WBC 8.5  HGB 9.4*  HCT 31.6*  MCV 69.0*  PLT 489*   Cardiac Enzymes: No results for input(s): CKTOTAL, CKMB, CKMBINDEX, TROPONINI in the last 168 hours. BNP: Invalid input(s): POCBNP CBG: No results for input(s): GLUCAP in the last 168 hours. D-Dimer No results for input(s): DDIMER in the last 72 hours. Hgb A1c No results for input(s): HGBA1C in the last 72 hours. Lipid Profile No results for input(s): CHOL, HDL, LDLCALC, TRIG, CHOLHDL, LDLDIRECT in the last 72 hours. Thyroid function studies No results for input(s): TSH, T4TOTAL, T3FREE, THYROIDAB in the last 72 hours.  Invalid input(s): FREET3 Anemia work up No results for input(s): VITAMINB12, FOLATE, FERRITIN, TIBC, IRON, RETICCTPCT in the last 72 hours. Urinalysis No results found for: COLORURINE, APPEARANCEUR, LABSPEC, PHURINE, GLUCOSEU, HGBUR, BILIRUBINUR, KETONESUR, PROTEINUR, UROBILINOGEN, NITRITE, LEUKOCYTESUR Sepsis Labs Invalid input(s): PROCALCITONIN,  WBC,  LACTICIDVEN Microbiology Recent Results (from the past 240 hour(s))  SARS Coronavirus 2 by RT PCR (hospital order, performed in [facility] hospital lab) Nasopharyngeal Nasopharyngeal Swab     Status: None   Collection Time: 07/17/20  1:29 AM   Specimen: Nasopharyngeal Swab  Result Value Ref Range Status   SARS Coronavirus 2 NEGATIVE NEGATIVE Final    Comment: (NOTE) SARS-CoV-2 target nucleic acids are NOT DETECTED.  The SARS-CoV-2 RNA is generally detectable in upper and lower respiratory specimens during the  acute phase of infection. The lowest concentration of SARS-CoV-2 viral copies this assay can detect is 250 copies / mL. A negative result does not preclude SARS-CoV-2 infection and should not be used as the sole basis for treatment or other patient management decisions.  A negative result may occur with improper specimen collection / handling, submission of specimen other than nasopharyngeal swab, presence of viral mutation(s) within the areas targeted by this assay, and inadequate number of viral copies (<250 copies / mL). A negative result must be combined with clinical observations, patient history, and epidemiological information.  Fact Sheet  for Patients:   BoilerBrush.com.cyhttps://www.fda.gov/media/136312/download  Fact Sheet for Healthcare Providers: https://pope.com/https://www.fda.gov/media/136313/download  This test is not yet approved or  cleared by the [place] FDA and has been authorized for detection and/or diagnosis of SARS-CoV-2 by FDA under an Emergency Use Authorization (EUA).  This EUA will remain in effect (meaning this test can be used) for the duration of the COVID-19 declaration under Section 564(b)(1) of the Act, 21 U.S.C. section 360bbb-3(b)(1), unless the authorization is terminated or revoked sooner.  Performed at [facility], [address] [place], [place] [address]      Time coordinating discharge in minutes: 65  SIGNED:   Calvert CantorSaima Abhiraj Dozal, MD  [facility] 07/19/2020, 10:08 AM

## 2020-07-19 NOTE — Progress Notes (Signed)
D/C instructions given and reviewed. No questions asked but encouraged to call with any concerns. Discussed new CHF teaching as well and ensured pt had CHF booklet. Tele and IV removed, tolerated well. Awaiting TOC pharmacy to deliver med.

## 2020-07-19 NOTE — Progress Notes (Addendum)
Heart Failure Stewardship Pharmacist Progress Note   PCP: Nathaneil Canary, PA-C PCP-Cardiologist: No primary care provider on file.    HPI:  49 yo F with PMH of HTN and chronic bronchitis. She presented to [facility] ED on 07/16/20 with chest pain, LE edema, and orthopnea. An ECHO was done on 07/17/20 and showed new low LVEF of 30-35%.  Current HF Medications: Furosemide 40 mg IV BID Entresto 49/51 mg BID  Prior to admission HF Medications: Telmisartan 40 mg daily  Pertinent Lab Values: . Serum creatinine 0.84, BUN 8, Potassium 3.9, Sodium 138, BNP 3260  Vital Signs: . Weight: 151 lbs (admission weight: 160 lbs) . Blood pressure: 130/90s   . Heart rate: 90-110s   Assessment: 1. Acute systolic CHF (EF 03-50%), likely due to hypertensive cardiomyopathy. NYHA class II symptoms. - Continue furosemide 40 mg IV BID. Plan for furosemide 40 mg daily at discharge - No beta blocker therapy ordered. Consider starting prior to discharge - Continue Entresto 49/51 mg BID - Consider starting spironolactone + SGLT2i as outpatient - Consider cath for ischemic eval since patient presented with chest pain on admission   Plan: 1) Medication changes recommended at this time: - Start beta blocker therapy  Sharen Hones, PharmD, BCPS Heart Failure Stewardship Pharmacist Phone [phone]

## 2020-07-19 NOTE — TOC Transition Note (Addendum)
Transition of Care [facility]) - CM/SW Discharge Note   Patient Details  Name: Nancy Branch MRN: [id number] Date of Birth: Jan 18, 1971  Transition of Care [facility]) CM/SW Contact:  Leone Haven, RN Phone Number: 07/19/2020, 12:07 PM   Clinical Narrative:    Patient is for dc today, NCM asked if she wanted a [facility] for CHF management she states no.  She will be on entresto which is new for her. MD will send the first 30 days free script to [facility] pharmacy to fill.    Final next level of care: Home/Self Care Barriers to Discharge: No Barriers Identified   Patient Goals and CMS Choice Patient states their goals for this hospitalization and ongoing recovery are:: get better   Choice offered to / list presented to : NA  Discharge Placement                       Discharge Plan and Services                  DME Agency: NA       HH Arranged: NA          Social Determinants of Health (SDOH) Interventions     Readmission Risk Interventions No flowsheet data found.

## 2020-07-19 NOTE — TOC Benefit Eligibility Note (Signed)
Transition of Care [facility]) Benefit Eligibility Note    Patient Details  Name: Nancy Branch MRN: [id number] Date of Birth: Oct 16, 1971   Medication/Dose: Sherryll Burger  Covered?: Yes  Tier: 3 Drug  Prescription Coverage Preferred Pharmacy: [facility] with Person/Company/Phone Number:: [facility]  Co-Pay: $20 for 30 day retail  Prior Approval: Yes ([id number] op 3)          Orson Aloe Phone Number: 07/19/2020, 12:41 PM

## 2020-07-29 ENCOUNTER — Encounter: Payer: Self-pay | Admitting: Cardiology

## 2020-07-29 ENCOUNTER — Other Ambulatory Visit: Payer: Self-pay

## 2020-07-29 ENCOUNTER — Ambulatory Visit: Payer: 59 | Admitting: Cardiology

## 2020-07-29 VITALS — BP 119/92 | HR 96 | Ht 65.0 in | Wt 156.0 lb

## 2020-07-29 DIAGNOSIS — I5020 Unspecified systolic (congestive) heart failure: Secondary | ICD-10-CM

## 2020-07-29 DIAGNOSIS — D509 Iron deficiency anemia, unspecified: Secondary | ICD-10-CM | POA: Insufficient documentation

## 2020-07-29 DIAGNOSIS — I10 Essential (primary) hypertension: Secondary | ICD-10-CM

## 2020-07-29 MED ORDER — ENTRESTO 97-103 MG PO TABS: 1.0000 | ORAL_TABLET | Freq: Two times a day (BID) | ORAL | 3 refills | Status: DC

## 2020-07-29 NOTE — Progress Notes (Signed)
Patient referred by Nancy Lush, PA-C for cardiomyopathy  Subjective:   Nancy Branch, female    DOB: Oct 16, 1971, 49 y.o.   MRN: [id number]  Chief Complaint  Patient presents with  . Congestive Heart Failure  . Hospitalization Follow-up  . New Patient (Initial Visit)    HPI  49 year old female with diagnosis of HFrEF  Patient works as Chemical engineer at [employer]. She has been active all her life. She was walking up to 8 miles/day until spring 2021. Starting summer 2021, she developed progressively worsening exertional dyspnea, fatigue, leg edema. Patient was admitted to [facility] on 08/01/2020 with complaints of shortness of breath, leg swelling, and chest pain.  Work-up showed EF of 30-35%, elevated BNP, mildly elevated troponin without rise and fall.  She was also found to have profound microcytic anemia with hemoglobin of 9.6, MCV 69.  Symptoms improved with IV diuresis.  She was discharged on Entresto 49-50 mg twice daily, carvedilol 3.125 mg bid, Lasix 40 mg daily, K-Lor 10 M EQ daily.  She denies any chest pain. She denies daily alcohol use. Her grandmother has had some cardiac illness, details not known.   She is feeling significantly Branch. Dyspnea, leg edema have essentially resolved.   She reports heavy periods for for quite some time. She saw an OBGYN for Nancy Branch, Nancy Branch with [facility]. She underwent endometrial biopsy, that showed secretory pattern endometrium, from a polyp.    Past Medical History:  Diagnosis Date  . Heart failure [facility])      Past Surgical History:  Procedure Laterality Date  . TUBAL LIGATION  2000     Social History   Tobacco Use  Smoking Status Never Smoker  Smokeless Tobacco Never Used    Social History   Substance and Sexual Activity  Alcohol Use Yes   Comment: occasional     Family History  Problem Relation Age of Onset  . Lung cancer Mother   . Hypertension Mother   . Prostate  cancer Father      Current Outpatient Medications on File Prior to Visit  Medication Sig Dispense Refill  . albuterol (VENTOLIN HFA) 108 (90 Base) MCG/ACT inhaler Inhale 2 puffs into the lungs every 6 (six) hours as needed for wheezing or shortness of breath.    Marland Kitchen aspirin EC 81 MG EC tablet Take 1 tablet (81 mg total) by mouth daily. Swallow whole. 30 tablet 11  . carvedilol (COREG) 3.125 MG tablet Take 1 tablet (3.125 mg total) by mouth 2 (two) times daily with a meal. 60 tablet 0  . cetirizine (ZYRTEC) 10 MG tablet Take 10 mg by mouth daily as needed for allergies.    . Elastic Bandages & Supports (T.E.D. BELOW KNEE/M-REGULAR) MISC 1 each by Does not apply route daily. 3 each 0  . ELDERBERRY PO Take 1 tablet by mouth daily.    . ferrous sulfate 325 (65 FE) MG tablet Take 325 mg by mouth daily with breakfast.    . fluticasone (FLONASE) 50 MCG/ACT nasal spray Place 2 sprays into both nostrils daily as needed.     . furosemide (LASIX) 40 MG tablet Take 1 tablet (40 mg total) by mouth daily. 30 tablet 0  . montelukast (SINGULAIR) 10 MG tablet Take 10 mg by mouth daily.    . potassium chloride (KLOR-CON) 10 MEQ tablet Take 1 tablet (10 mEq total) by mouth daily. 30 tablet 0  . sacubitril-valsartan (ENTRESTO) 49-51 MG Take 1  tablet by mouth 2 (two) times daily. 60 tablet 0  . triamcinolone cream (KENALOG) 0.1 % Apply 1 application topically 2 (two) times daily.    . vitamin B-12 (CYANOCOBALAMIN) 500 MCG tablet Take 500 mcg by mouth daily.    Marland Kitchen VITAMIN D PO Take 1 tablet by mouth daily.    . Zinc Gluconate 15 MG TABS Take 15 mg by mouth daily.      No current facility-administered medications on file prior to visit.    Cardiovascular and other pertinent studies:  EKG 07/29/2020: Sinus rhythm 98 bpm Biatrial enlargement Left ventricular hypertrophy IVCD T wave inversion likely due to LVH   Echocardiogram 07/2020: 1. Left ventricular ejection fraction, by estimation, is 30 to 35%. The    left ventricle has moderately decreased function. The left ventricle  demonstrates global hypokinesis. The left ventricular internal cavity size  was mildly dilated. There is mild  left ventricular hypertrophy. Left ventricular diastolic parameters are  consistent with Grade II diastolic dysfunction (pseudonormalization).  Elevated left atrial pressure.   2. Right ventricular systolic function is normal. The right ventricular  size is mildly enlarged. There is mildly elevated pulmonary artery  systolic pressure. The estimated right ventricular systolic pressure is  95.1 mmHg.   3. Left atrial size was moderately dilated.   4. The mitral valve is normal in structure. Mild mitral valve  regurgitation.   5. The aortic valve is tricuspid. Aortic valve regurgitation is not  visualized. No aortic stenosis is present.   6. The inferior vena cava is dilated in size with <50% respiratory  variability, suggesting right atrial pressure of 15 mmHg.     Recent labs: 07/19/2020: Glucose 117, BUN/Cr 8/0.84. EGFR >60. Na/K 138/3.9.  H/H 9.4/69. MCV 69. Platelets 489  Results for Nancy Branch (MRN [id number]) as of 07/29/2020 10:03  Ref. Range 07/16/2020 19:30 07/16/2020 20:02 07/16/2020 21:09 07/16/2020 22:26 07/16/2020 22:42 07/16/2020 23:26 07/17/2020 01:29 07/17/2020 05:00 07/17/2020 05:00 07/17/2020 06:33  B Natriuretic Peptide Latest Ref Range: 0.0 - 100.0 pg/mL 3,260.2 (H)           Troponin I (High Sensitivity) Latest Ref Range: <18 ng/L 34 (H)   30 (H)      32 (H)     Review of Systems  Cardiovascular: Negative for chest pain, dyspnea on exertion (Previously present, now resolved.), leg swelling (Previously present, now resolved.), palpitations and syncope.         Vitals:   07/29/20 1239  BP: (!) 119/92  Pulse: 96  SpO2: 98%     Body mass index is 25.96 kg/m. Filed Weights   07/29/20 1239  Weight: 156 lb (70.8 kg)     Objective:   Physical Exam Vitals and nursing note reviewed.   Constitutional:      General: She is not in acute distress. Neck:     Vascular: No JVD.  Cardiovascular:     Rate and Rhythm: Normal rate and regular rhythm.     Heart sounds: Normal heart sounds. No murmur heard.   Pulmonary:     Effort: Pulmonary effort is normal.     Breath sounds: Normal breath sounds. No wheezing or rales.         Assessment & Recommendations:   49 year old female with HFrEF, microcytic blood loss anemia  HFrEF: Clinically compensated.  Likely nonischemic cardiomyopathy with no definite reversible etiology.  Will obtain CCTA at some point. Increase Entresto to 97-103 mg bid. Continue carvedilol 3.125 mg bid. BMP, TSH in  1-2 weeks. Will consider adding corlanor at next visit.   Microcytic anemia: Likely blood loss anemia due to perimenopausal bleeding. Recommend f/u w/OBGYN. If no improvement in Hg, may need IV iron.      Thank you for referring the patient to [place]. Please feel free to contact with any questions.   Nigel Mormon, MD Pager: [phone] Office: [phone]

## 2020-08-11 LAB — PRO B NATRIURETIC PEPTIDE: NT-Pro BNP: 1956 pg/mL — ABNORMAL HIGH (ref 0–249)

## 2020-08-11 LAB — TSH: TSH: 2.61 u[IU]/mL (ref 0.450–4.500)

## 2020-08-11 NOTE — Progress Notes (Signed)
Lets discuss in office.  

## 2020-08-23 ENCOUNTER — Other Ambulatory Visit: Payer: Self-pay

## 2020-08-23 DIAGNOSIS — I5020 Unspecified systolic (congestive) heart failure: Secondary | ICD-10-CM

## 2020-08-23 MED ORDER — ENTRESTO 97-103 MG PO TABS: 1.0000 | ORAL_TABLET | Freq: Two times a day (BID) | ORAL | 3 refills | Status: DC

## 2020-08-30 NOTE — Progress Notes (Signed)
Patient referred by Nancy Lush, PA-C for cardiomyopathy  Subjective:   Nancy Branch, female    DOB: 10-29-1971, 49 y.o.   MRN: [id number]   Chief Complaint  Patient presents with   HFrEF   Follow-up    3 week    HPI  49 year old female with HFrEF  She is tolerating medications well. She denies chest pain, shortness of breath, palpitations, leg edema, orthopnea, PND, TIA/syncope. Recent lab results reviewed with the patient.    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 She was walking up to 8 miles/day until spring 2021. Starting summer 2021, she developed progressively worsening exertional dyspnea, fatigue, leg edema. Patient was admitted to [facility] on 08/01/2020 with complaints of shortness of breath, leg swelling, and chest pain.  Work-up showed EF of 30-35%, elevated BNP, mildly elevated troponin without rise and fall.  She was also found to have profound microcytic anemia with hemoglobin of 9.6, MCV 69.  Symptoms improved with IV diuresis.  She was discharged on Entresto 49-50 mg twice daily, carvedilol 3.125 mg bid, Lasix 40 mg daily, K-Lor 10 M EQ daily.  She denies any chest pain. She denies daily alcohol use. Her grandmother has had some cardiac illness, details not known.   She is feeling significantly Branch. Dyspnea, leg edema have essentially resolved.   She reports heavy periods for for quite some time. She saw an OBGYN for Nancy Branch, Dr. Leone Branch with [facility]. She underwent endometrial biopsy, that showed secretory pattern endometrium, from a polyp.     Current Outpatient Medications on File Prior to Visit  Medication Sig Dispense Refill   albuterol (VENTOLIN HFA) 108 (90 Base) MCG/ACT inhaler Inhale 2 puffs into the lungs every 6 (six) hours as needed for wheezing or shortness of breath.     aspirin EC 81 MG EC tablet Take 1 tablet (81 mg total)  by mouth daily. Swallow whole. 30 tablet 11   carvedilol (COREG) 3.125 MG tablet Take 1 tablet (3.125 mg total) by mouth 2 (two) times daily with a meal. 60 tablet 0   cetirizine (ZYRTEC) 10 MG tablet Take 10 mg by mouth daily as needed for allergies.     Elastic Bandages & Supports (T.E.D. BELOW KNEE/M-REGULAR) MISC 1 each by Does not apply route daily. 3 each 0   ELDERBERRY PO Take 1 tablet by mouth daily.     ferrous sulfate 325 (65 FE) MG tablet Take 325 mg by mouth daily with breakfast.     fluticasone (FLONASE) 50 MCG/ACT nasal spray Place 2 sprays into both nostrils daily as needed.      furosemide (LASIX) 40 MG tablet Take 1 tablet (40 mg total) by mouth daily. 30 tablet 0   montelukast (SINGULAIR) 10 MG tablet Take 10 mg by mouth daily.     potassium chloride (KLOR-CON) 10 MEQ tablet Take 1 tablet (10 mEq total) by mouth daily. 30 tablet 0   sacubitril-valsartan (ENTRESTO) 97-103 MG Take 1 tablet by mouth 2 (two) times daily. 60 tablet 3   triamcinolone cream (KENALOG) 0.1 % Apply 1 application topically 2 (two) times daily.     vitamin B-12 (CYANOCOBALAMIN) 500 MCG tablet Take 500 mcg by mouth daily.     VITAMIN D PO Take 1 tablet by mouth daily.     Zinc Gluconate 15 MG TABS Take 15 mg by mouth daily.  No current facility-administered medications on file prior to visit.    Cardiovascular and other pertinent studies:  EKG 07/29/2020: Sinus rhythm 98 bpm Biatrial enlargement Left ventricular hypertrophy IVCD T wave inversion likely due to LVH   Echocardiogram 07/2020: 1. Left ventricular ejection fraction, by estimation, is 30 to 35%. The  left ventricle has moderately decreased function. The left ventricle  demonstrates global hypokinesis. The left ventricular internal cavity size  was mildly dilated. There is mild  left ventricular hypertrophy. Left ventricular diastolic parameters are  consistent with Grade II diastolic dysfunction (pseudonormalization).   Elevated left atrial pressure.   2. Right ventricular systolic function is normal. The right ventricular  size is mildly enlarged. There is mildly elevated pulmonary artery  systolic pressure. The estimated right ventricular systolic pressure is  10.9 mmHg.   3. Left atrial size was moderately dilated.   4. The mitral valve is normal in structure. Mild mitral valve  regurgitation.   5. The aortic valve is tricuspid. Aortic valve regurgitation is not  visualized. No aortic stenosis is present.   6. The inferior vena cava is dilated in size with <50% respiratory  variability, suggesting right atrial pressure of 15 mmHg.     Recent labs: 08/10/2020: NT pro BNP 1956 TSH 2.6  07/19/2020: Glucose 117, BUN/Cr 8/0.84. EGFR >60. Na/K 138/3.9.  H/H 9.4/69. MCV 69. Platelets 489  Results for Nancy, Branch (MRN [id number]) as of 07/29/2020 10:03  Ref. Range 07/16/2020 19:30 07/16/2020 20:02 07/16/2020 21:09 07/16/2020 22:26 07/16/2020 22:42 07/16/2020 23:26 07/17/2020 01:29 07/17/2020 05:00 07/17/2020 05:00 07/17/2020 06:33  B Natriuretic Peptide Latest Ref Range: 0.0 - 100.0 pg/mL 3,260.2 (H)           Troponin I (High Sensitivity) Latest Ref Range: <18 ng/L 34 (H)   30 (H)      32 (H)     Review of Systems  Cardiovascular: Negative for chest pain, dyspnea on exertion (Previously present, now resolved.), leg swelling (Previously present, now resolved.), palpitations and syncope.         Vitals:   08/31/20 1549  BP: 134/80  Pulse: 99  Resp: 16  SpO2: 99%     Body mass index is 26.13 kg/m. Filed Weights   08/31/20 1549  Weight: 157 lb (71.2 kg)     Objective:   Physical Exam Vitals and nursing note reviewed.  Constitutional:      General: She is not in acute distress. Neck:     Vascular: No JVD.  Cardiovascular:     Rate and Rhythm: Normal rate and regular rhythm.     Heart sounds: Normal heart sounds. No murmur heard.   Pulmonary:     Effort: Pulmonary effort is normal.     Breath  sounds: Normal breath sounds. No wheezing or rales.         Assessment & Recommendations:   49 year old female with HFrEF, microcytic blood loss anemia  HFrEF: Clinically compensated.  Likely nonischemic cardiomyopathy with no definite reversible etiology.  Will obtain CCTA at some point. Conitnue Entresto to 97-103 mg bid. Increase carvedilol to 6.25 mg bid. Take lasix only as needed, stop potassium. Low suspicion for CAD. Stop Aspirin  Microcytic anemia: Likely blood loss anemia due to perimenopausal bleeding. Recommend f/u w/OBGYN. If no improvement in Hg, may need IV iron.     Thank you for referring the patient to [place]. Please feel free to contact with any questions.   Nigel Mormon, MD Pager: [phone] Office: [phone]

## 2020-08-31 ENCOUNTER — Other Ambulatory Visit: Payer: Self-pay

## 2020-08-31 ENCOUNTER — Ambulatory Visit: Payer: Self-pay | Admitting: Cardiology

## 2020-08-31 ENCOUNTER — Encounter: Payer: Self-pay | Admitting: Cardiology

## 2020-08-31 VITALS — BP 134/80 | HR 99 | Resp 16 | Ht 65.0 in | Wt 157.0 lb

## 2020-08-31 DIAGNOSIS — I10 Essential (primary) hypertension: Secondary | ICD-10-CM

## 2020-08-31 DIAGNOSIS — I5020 Unspecified systolic (congestive) heart failure: Secondary | ICD-10-CM

## 2020-08-31 DIAGNOSIS — D509 Iron deficiency anemia, unspecified: Secondary | ICD-10-CM

## 2020-08-31 MED ORDER — FUROSEMIDE 40 MG PO TABS: 40.0000 mg | ORAL_TABLET | Freq: Every day | ORAL | 1 refills | Status: DC | PRN

## 2020-08-31 MED ORDER — CARVEDILOL 6.25 MG PO TABS: 6.2500 mg | ORAL_TABLET | Freq: Two times a day (BID) | ORAL | 2 refills | Status: AC

## 2020-09-08 ENCOUNTER — Other Ambulatory Visit: Payer: Self-pay

## 2020-09-08 ENCOUNTER — Telehealth: Payer: Self-pay

## 2020-09-08 DIAGNOSIS — I5020 Unspecified systolic (congestive) heart failure: Secondary | ICD-10-CM

## 2020-09-08 MED ORDER — POTASSIUM CHLORIDE ER 10 MEQ PO TBCR: 10.0000 meq | EXTENDED_RELEASE_TABLET | Freq: Every day | ORAL | 3 refills | Status: DC

## 2020-09-08 MED ORDER — FUROSEMIDE 40 MG PO TABS: 40.0000 mg | ORAL_TABLET | Freq: Every day | ORAL | 1 refills | Status: AC | PRN

## 2020-09-08 NOTE — Telephone Encounter (Signed)
What dose for lasix and potassium please advise

## 2020-09-08 NOTE — Telephone Encounter (Signed)
Dose that she was previously on. I had stopped it at last visit. 40 mg and 10 mEq of lasix and potassium, respectively.  Thanks MJP

## 2020-09-08 NOTE — Telephone Encounter (Signed)
Recommend taking lasix 1 pill twice daily, along with potassium 1 pill once daily, until f/u w/me on 11/3. Happy to see her tomorrow, if he would like to be seen sooner.  Thanks MJP

## 2020-09-08 NOTE — Telephone Encounter (Signed)
Patient called she is having sob and a cough and would like to know what she can take for it please advise

## 2020-09-14 ENCOUNTER — Ambulatory Visit: Payer: 59 | Admitting: Cardiology

## 2020-09-14 ENCOUNTER — Other Ambulatory Visit: Payer: Self-pay

## 2020-09-14 NOTE — Progress Notes (Deleted)
Patient referred by Sue Lush, PA-C for cardiomyopathy  Subjective:   Nancy Branch, female    DOB: [birth year], 49 y.o.   MRN: [id number]   No chief complaint on file.   HPI  49 year old female with HFrEF  She is tolerating medications well. She denies chest pain, shortness of breath, palpitations, leg edema, orthopnea, PND, TIA/syncope. Recent lab results reviewed with the patient.    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 She was walking up to 8 miles/day until spring 2021. Starting summer 2021, she developed progressively worsening exertional dyspnea, fatigue, leg edema. Patient was admitted to [facility] on 08/01/2020 with complaints of shortness of breath, leg swelling, and chest pain.  Work-up showed EF of 30-35%, elevated BNP, mildly elevated troponin without rise and fall.  She was also found to have profound microcytic anemia with hemoglobin of 9.6, MCV 69.  Symptoms improved with IV diuresis.  She was discharged on Entresto 49-50 mg twice daily, carvedilol 3.125 mg bid, Lasix 40 mg daily, K-Lor 10 M EQ daily.  She denies any chest pain. She denies daily alcohol use. Her grandmother has had some cardiac illness, details not known.   She is feeling significantly Branch. Dyspnea, leg edema have essentially resolved.   She reports heavy periods for for quite some time. She saw an OBGYN for Adrian Blackwater, Dr. Leone Haven with [facility]. She underwent endometrial biopsy, that showed secretory pattern endometrium, from a polyp.     Current Outpatient Medications on File Prior to Visit  Medication Sig Dispense Refill  . albuterol (VENTOLIN HFA) 108 (90 Base) MCG/ACT inhaler Inhale 2 puffs into the lungs every 6 (six) hours as needed for wheezing or shortness of breath.    . carvedilol (COREG) 6.25 MG tablet Take 1 tablet (6.25 mg total) by mouth 2 (two) times daily with a  meal. 60 tablet 2  . cetirizine (ZYRTEC) 10 MG tablet Take 10 mg by mouth daily as needed for allergies.    . Elastic Bandages & Supports (T.E.D. BELOW KNEE/M-REGULAR) MISC 1 each by Does not apply route daily. 3 each 0  . ELDERBERRY PO Take 1 tablet by mouth daily.    . ferrous sulfate 325 (65 FE) MG tablet Take 325 mg by mouth daily with breakfast.    . fluticasone (FLONASE) 50 MCG/ACT nasal spray Place 2 sprays into both nostrils daily as needed.     . furosemide (LASIX) 40 MG tablet Take 1 tablet (40 mg total) by mouth daily as needed. 30 tablet 1  . montelukast (SINGULAIR) 10 MG tablet Take 10 mg by mouth daily.    . pantoprazole (PROTONIX) 40 MG tablet Take 40 mg by mouth daily.    . potassium chloride (KLOR-CON) 10 MEQ tablet Take 1 tablet (10 mEq total) by mouth daily. 90 tablet 3  . sacubitril-valsartan (ENTRESTO) 97-103 MG Take 1 tablet by mouth 2 (two) times daily. 60 tablet 3  . triamcinolone cream (KENALOG) 0.1 % Apply 1 application topically 2 (two) times daily.    . vitamin B-12 (CYANOCOBALAMIN) 500 MCG tablet Take 500 mcg by mouth daily.    Marland Kitchen VITAMIN D PO Take 1 tablet by mouth daily.    . Zinc Gluconate 15 MG TABS Take 15 mg by mouth daily.      No current facility-administered medications on file prior to visit.    Cardiovascular and other pertinent studies:  EKG 07/29/2020: Sinus rhythm 98 bpm Biatrial enlargement Left ventricular hypertrophy IVCD T wave inversion likely due to LVH   Echocardiogram 07/2020: 1. Left ventricular ejection fraction, by estimation, is 30 to 35%. The  left ventricle has moderately decreased function. The left ventricle  demonstrates global hypokinesis. The left ventricular internal cavity size  was mildly dilated. There is mild  left ventricular hypertrophy. Left ventricular diastolic parameters are  consistent with Grade II diastolic dysfunction (pseudonormalization).  Elevated left atrial pressure.   2. Right ventricular systolic  function is normal. The right ventricular  size is mildly enlarged. There is mildly elevated pulmonary artery  systolic pressure. The estimated right ventricular systolic pressure is  61.5 mmHg.   3. Left atrial size was moderately dilated.   4. The mitral valve is normal in structure. Mild mitral valve  regurgitation.   5. The aortic valve is tricuspid. Aortic valve regurgitation is not  visualized. No aortic stenosis is present.   6. The inferior vena cava is dilated in size with <50% respiratory  variability, suggesting right atrial pressure of 15 mmHg.     Recent labs: 08/10/2020: NT pro BNP 1956 TSH 2.6  07/19/2020: Glucose 117, BUN/Cr 8/0.84. EGFR >60. Na/K 138/3.9.  H/H 9.4/69. MCV 69. Platelets 489  Results for Nancy Branch, Nancy Branch (MRN [id number]) as of 07/29/2020 10:03  Ref. Range 07/16/2020 19:30 07/16/2020 20:02 07/16/2020 21:09 07/16/2020 22:26 07/16/2020 22:42 07/16/2020 23:26 07/17/2020 01:29 07/17/2020 05:00 07/17/2020 05:00 07/17/2020 06:33  B Natriuretic Peptide Latest Ref Range: 0.0 - 100.0 pg/mL 3,260.2 (H)           Troponin I (High Sensitivity) Latest Ref Range: <18 ng/L 34 (H)   30 (H)      32 (H)     Review of Systems  Cardiovascular: Negative for chest pain, dyspnea on exertion (Previously present, now resolved.), leg swelling (Previously present, now resolved.), palpitations and syncope.         There were no vitals filed for this visit.   There is no height or weight on file to calculate BMI. There were no vitals filed for this visit.   Objective:   Physical Exam Vitals and nursing note reviewed.  Constitutional:      General: She is not in acute distress. Neck:     Vascular: No JVD.  Cardiovascular:     Rate and Rhythm: Normal rate and regular rhythm.     Heart sounds: Normal heart sounds. No murmur heard.   Pulmonary:     Effort: Pulmonary effort is normal.     Breath sounds: Normal breath sounds. No wheezing or rales.         Assessment &  Recommendations:   49 year old female with HFrEF, microcytic blood loss anemia  HFrEF: Clinically compensated.  Likely nonischemic cardiomyopathy with no definite reversible etiology.  Will obtain CCTA at some point. Conitnue Entresto to 97-103 mg bid. ***Increase carvedilol to 6.25 mg bid. Take lasix only as needed, stop potassium. Low suspicion for CAD. Stop Aspirin  Microcytic anemia: Likely blood loss anemia due to perimenopausal bleeding. Recommend f/u w/OBGYN. If no improvement in Hg, may need IV iron.     Thank you for referring the patient to [place]. Please feel free to contact with any questions.   Nigel Mormon, MD Pager: [phone] Office: [phone]

## 2020-11-23 ENCOUNTER — Other Ambulatory Visit: Payer: BLUE CROSS/BLUE SHIELD

## 2020-11-23 DIAGNOSIS — Z20822 Contact with and (suspected) exposure to covid-19: Secondary | ICD-10-CM

## 2020-11-24 LAB — SARS-COV-2, NAA 2 DAY TAT

## 2020-11-24 LAB — NOVEL CORONAVIRUS, NAA: SARS-CoV-2, NAA: NOT DETECTED

## 2021-04-17 ENCOUNTER — Other Ambulatory Visit: Payer: Self-pay | Admitting: Cardiology

## 2021-04-17 DIAGNOSIS — I5020 Unspecified systolic (congestive) heart failure: Secondary | ICD-10-CM

## 2021-05-12 ENCOUNTER — Other Ambulatory Visit: Payer: Self-pay | Admitting: Cardiology

## 2021-05-12 DIAGNOSIS — I5020 Unspecified systolic (congestive) heart failure: Secondary | ICD-10-CM

## 2021-08-09 IMAGING — DX DG CHEST 2V
2 series · 2 of 2 positions shown · non-contrast
Comparison: None.

CLINICAL DATA: Chest pain, productive cough, shortness of breath

EXAM:
CHEST - 2 VIEW

[chest pa]
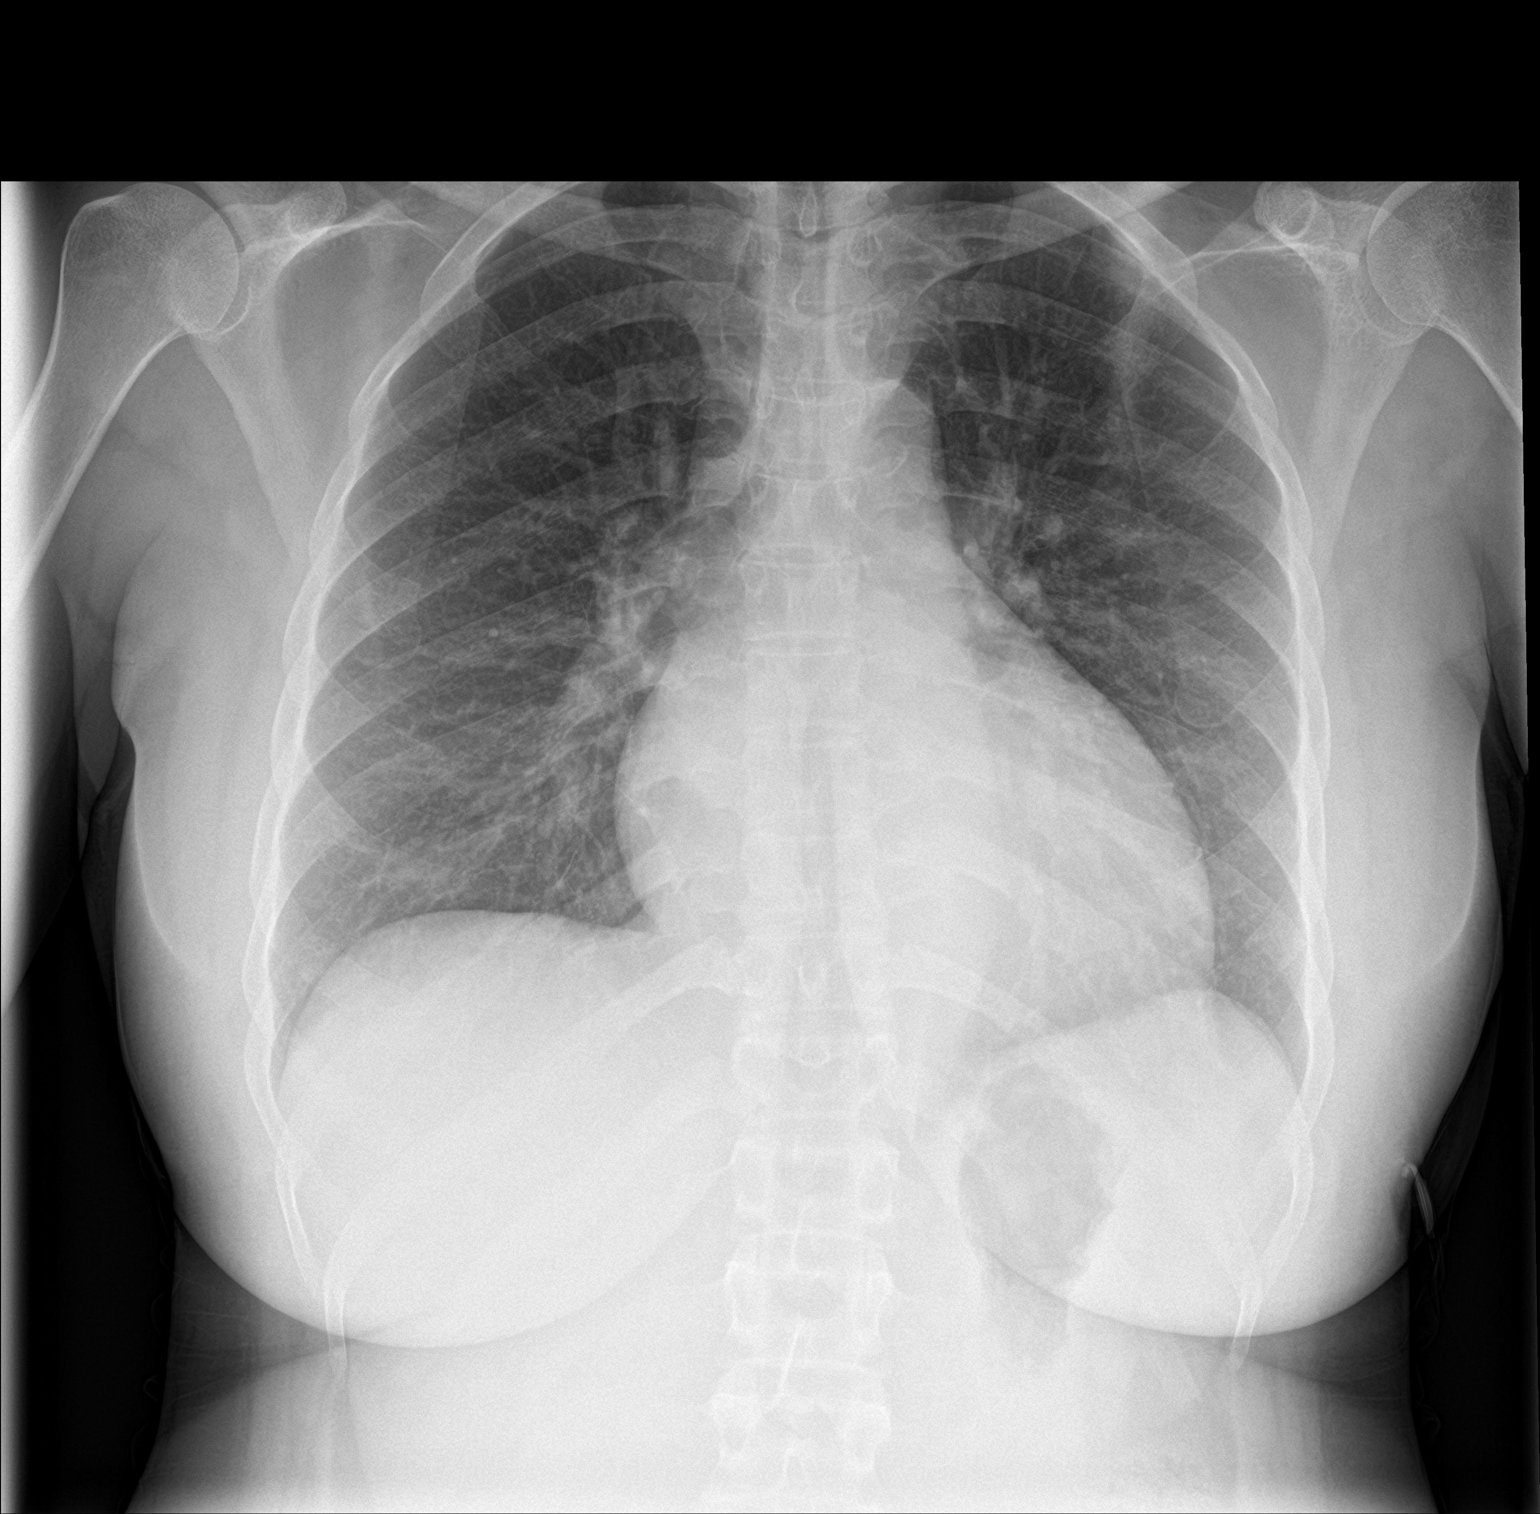

[chest lat]
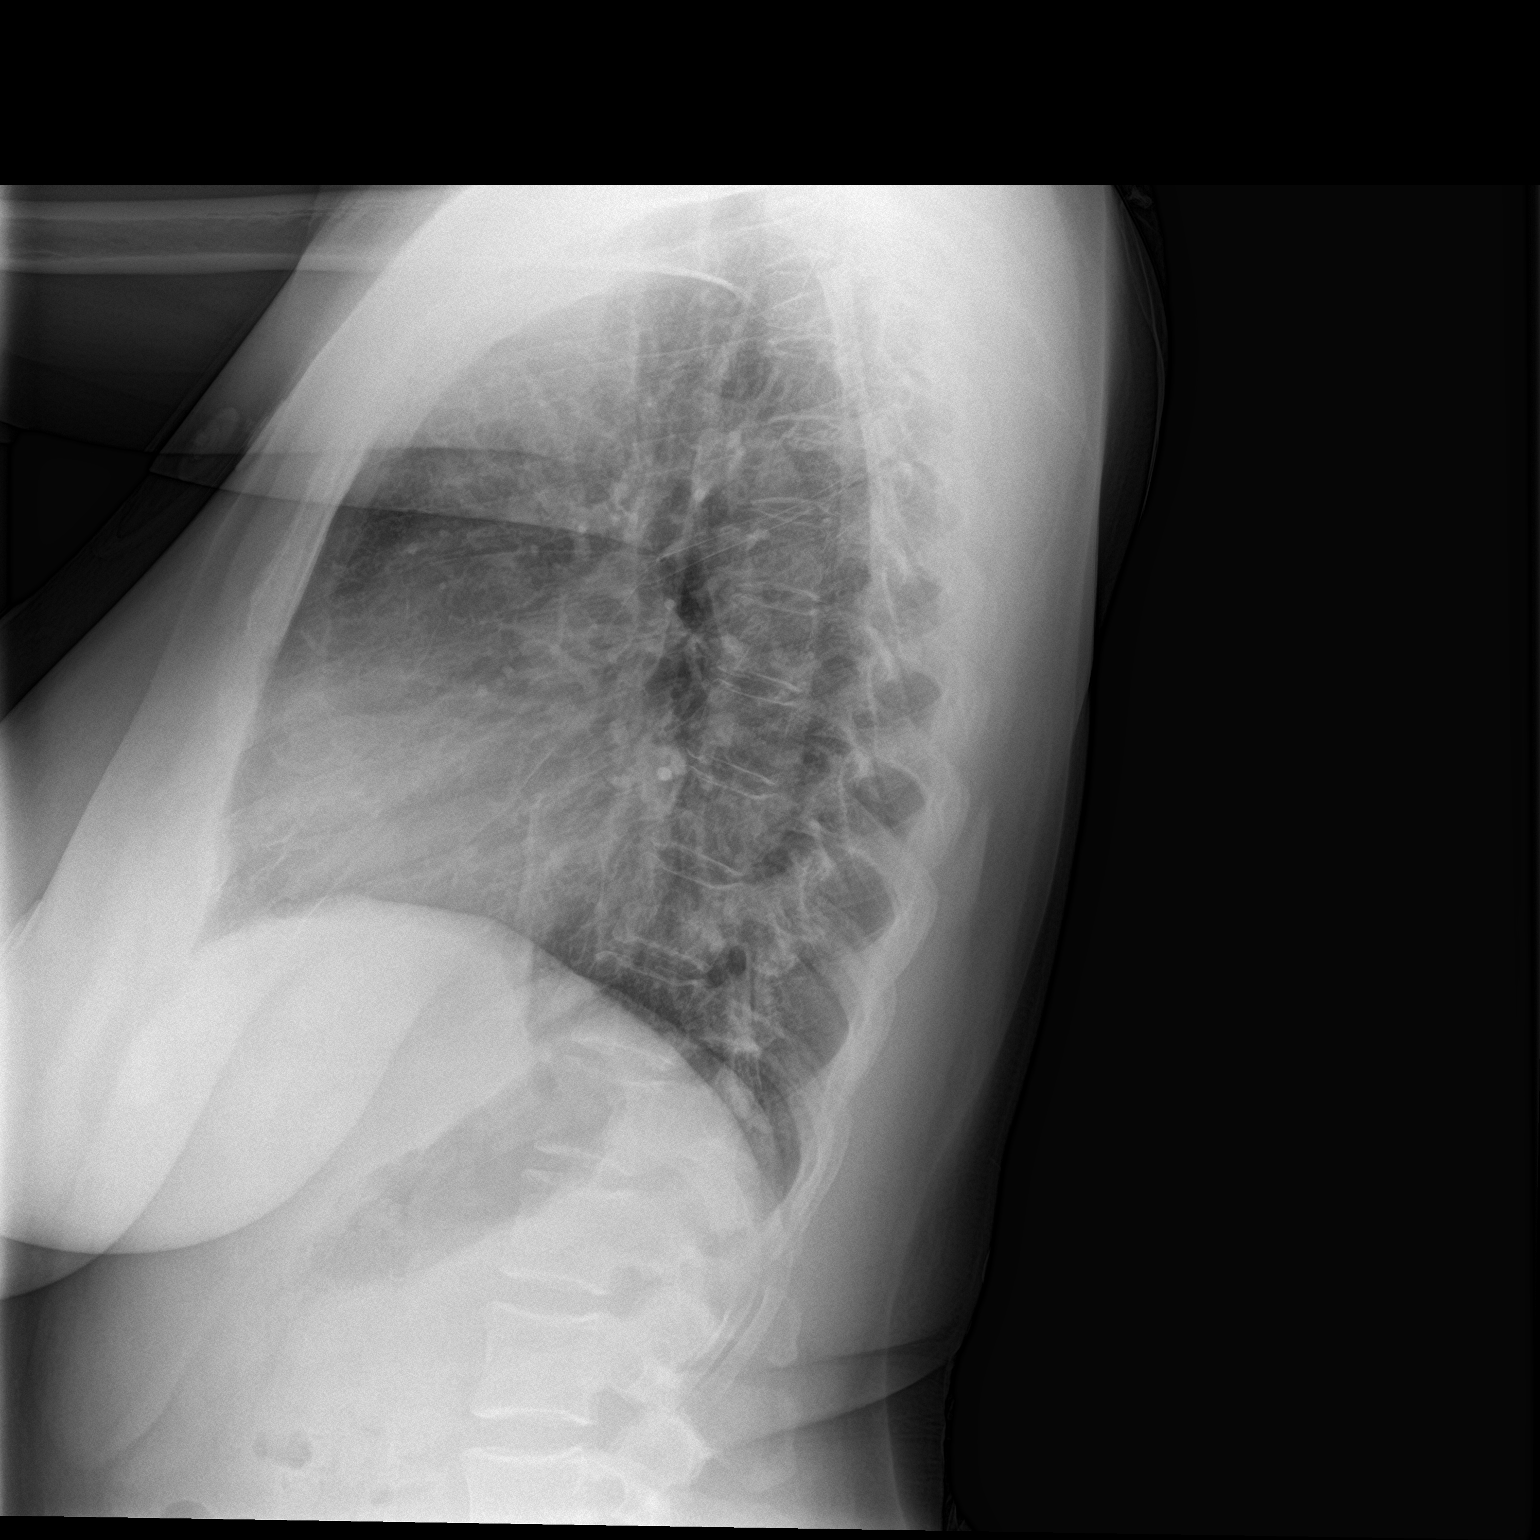

[2 of 2 positions shown; findings below may reference images not displayed]

FINDINGS: The heart size and mediastinal contours are within normal limits.
Both lungs are clear. The visualized skeletal structures are
unremarkable.
IMPRESSION: No active cardiopulmonary disease.

## 2021-10-08 ENCOUNTER — Emergency Department (HOSPITAL_COMMUNITY)
Admission: EM | Admit: 2021-10-08 | Discharge: 2021-10-08 | Disposition: A | Discharge status: Home / Self Care | Payer: BLUE CROSS/BLUE SHIELD | Attending: Emergency Medicine | Admitting: Emergency Medicine

## 2021-10-08 ENCOUNTER — Other Ambulatory Visit: Payer: Self-pay

## 2021-10-08 ENCOUNTER — Encounter (HOSPITAL_COMMUNITY): Payer: Self-pay

## 2021-10-08 DIAGNOSIS — Y92410 Unspecified street and highway as the place of occurrence of the external cause: Secondary | ICD-10-CM | POA: Diagnosis not present

## 2021-10-08 DIAGNOSIS — M542 Cervicalgia: Secondary | ICD-10-CM | POA: Diagnosis not present

## 2021-10-08 DIAGNOSIS — M549 Dorsalgia, unspecified: Secondary | ICD-10-CM | POA: Insufficient documentation

## 2021-10-08 DIAGNOSIS — M791 Myalgia, unspecified site: Secondary | ICD-10-CM

## 2021-10-08 DIAGNOSIS — I5041 Acute combined systolic (congestive) and diastolic (congestive) heart failure: Secondary | ICD-10-CM | POA: Insufficient documentation

## 2021-10-08 DIAGNOSIS — Z79899 Other long term (current) drug therapy: Secondary | ICD-10-CM | POA: Insufficient documentation

## 2021-10-08 DIAGNOSIS — I110 Hypertensive heart disease with heart failure: Secondary | ICD-10-CM | POA: Insufficient documentation

## 2021-10-08 DIAGNOSIS — M25512 Pain in left shoulder: Secondary | ICD-10-CM | POA: Diagnosis not present

## 2021-10-08 MED ORDER — METHOCARBAMOL 500 MG PO TABS: 500.0000 mg | ORAL_TABLET | Freq: Every evening | ORAL | 0 refills | Status: DC | PRN

## 2021-10-08 MED ORDER — ACETAMINOPHEN 500 MG PO TABS: 1000.0000 mg | ORAL_TABLET | Freq: Once | ORAL | Status: DC
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
## 2021-10-08 NOTE — Discharge Instructions (Signed)
Take ibuprofen 3 times a day with meals as needed for pain.  Do not take other anti-inflammatories at the same time (Advil, Motrin, naproxen, Aleve). You may supplement with Tylenol if you need further pain control. °Use robaxin as needed for muscle stiffness or soreness.  Have caution, this may make you tired or groggy.  Do not drive or operate heavy machinery while taking this medicine. °Use ice packs or heating pads if this helps control your pain. °You will likely have continued muscle stiffness and soreness over the next couple days.  Follow-up with primary care in 1 week if your symptoms are not improving. °Return to the emergency room if you develop vision changes, vomiting, slurred speech, numbness, loss of bowel or bladder control, or any new or worsening symptoms. ° °
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
## 2021-12-15 ENCOUNTER — Other Ambulatory Visit: Payer: Self-pay | Admitting: Cardiology

## 2022-01-07 ENCOUNTER — Other Ambulatory Visit: Payer: Self-pay | Admitting: Cardiology

## 2022-03-25 ENCOUNTER — Other Ambulatory Visit: Payer: Self-pay | Admitting: Cardiology

## 2022-07-12 ENCOUNTER — Other Ambulatory Visit: Payer: Self-pay | Admitting: Cardiology

## 2022-07-14 ENCOUNTER — Other Ambulatory Visit: Payer: Self-pay | Admitting: Cardiology

## 2022-08-30 ENCOUNTER — Other Ambulatory Visit: Payer: Self-pay | Admitting: Cardiology

## 2024-09-19 ENCOUNTER — Other Ambulatory Visit: Payer: Self-pay

## 2024-09-19 ENCOUNTER — Encounter (HOSPITAL_COMMUNITY): Payer: Self-pay

## 2024-09-19 ENCOUNTER — Ambulatory Visit (HOSPITAL_COMMUNITY): Admission: EM | Admit: 2024-09-19 | Discharge: 2024-09-19 | Disposition: A | Discharge status: Home / Self Care

## 2024-09-19 ENCOUNTER — Ambulatory Visit (INDEPENDENT_AMBULATORY_CARE_PROVIDER_SITE_OTHER)

## 2024-09-19 DIAGNOSIS — M79672 Pain in left foot: Secondary | ICD-10-CM | POA: Diagnosis not present

## 2024-09-19 DIAGNOSIS — M79671 Pain in right foot: Secondary | ICD-10-CM | POA: Diagnosis not present

## 2024-09-19 MED ORDER — DICLOFENAC SODIUM 1 % EX GEL
2.0000 g | Freq: Four times a day (QID) | CUTANEOUS | 0 refills | Status: AC
Start: 2024-09-19 — End: ?

## 2024-09-19 NOTE — ED Triage Notes (Signed)
 Pt c/o heel pain bilat, but states right heel is worsex3wks
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
## 2024-09-19 NOTE — Discharge Instructions (Signed)
 Your x-ray normal with no evidence of a stress fracture.  I recommend that you roll your foot over a cold ice bottle when you first wake up in the morning and see if this helps your symptoms.  Apply diclofenac gel up to 4 times a day.  Try to keep your feet elevated is much as possible and stay off of them when possible.  Make sure that you are wearing supportive footwear.  If your symptoms are not improving within a few days please follow-up with [facility] specialist.  If anything worsens and you have increasing pain, redness, swelling, numbness or tingling in the foot you need to be seen immediately.

## 2024-10-22 ENCOUNTER — Ambulatory Visit (INDEPENDENT_AMBULATORY_CARE_PROVIDER_SITE_OTHER)

## 2024-10-22 ENCOUNTER — Ambulatory Visit: Admitting: Podiatry

## 2024-10-22 DIAGNOSIS — M722 Plantar fascial fibromatosis: Secondary | ICD-10-CM | POA: Diagnosis not present

## 2024-10-22 MED ORDER — TRIAMCINOLONE ACETONIDE 10 MG/ML IJ SUSP: 10.0000 mg | Freq: Once | INTRAMUSCULAR | Status: AC

## 2024-10-22 MED ORDER — DICLOFENAC SODIUM 75 MG PO TBEC: 75.0000 mg | DELAYED_RELEASE_TABLET | Freq: Two times a day (BID) | ORAL | 2 refills | Status: AC

## 2024-10-23 NOTE — Progress Notes (Signed)
 Subjective:   Patient ID: Nancy Branch, female   DOB: 53 y.o.   MRN: [id number]   HPI Patient has developed a lot of pain in both of her heels and states it is worse with prolonged standing and work and it is hard to walk on.  States has been going on at least 2 months and she does have relative flatfoot structure.  Patient does not smoke likes to be active   Review of Systems  All other systems reviewed and are negative.       Objective:  Physical Exam Vitals and nursing note reviewed.  Constitutional:      Appearance: She is well-developed.  Pulmonary:     Effort: Pulmonary effort is normal.  Musculoskeletal:        General: Normal range of motion.  Skin:    General: Skin is warm.  Neurological:     Mental Status: She is alert.     Neurovascular status intact muscle strength found to be adequate range of motion adequate with patient found to have acute discomfort in the plantar aspect of the heel region bilateral fluid buildup around the medial band very tender when pressed with no history of injury.  Good digital perfusion well-oriented x 3     Assessment:  Acute plantar fasciitis right inflammation fluid around the medial band with pain     Plan:  H&P done sterile prep injected the medial band of the plantar fascia bilateral 3 mg Kenalog 5 mg Xylocaine gave instructions on physical therapy gave instructions on shoe gear modification discussed orthotics which I will make for her assuming we can get symptoms under control.  She does have flatfoot deformity and this is an acquired structural deformity  X-rays indicate spur formation with no indication stress fracture arthritis moderate depression of the arch with spur be minimal

## 2024-11-02 ENCOUNTER — Ambulatory Visit (INDEPENDENT_AMBULATORY_CARE_PROVIDER_SITE_OTHER): Admitting: Podiatry

## 2024-11-02 ENCOUNTER — Encounter: Payer: Self-pay | Admitting: Podiatry

## 2024-11-02 DIAGNOSIS — M216X1 Other acquired deformities of right foot: Secondary | ICD-10-CM

## 2024-11-02 DIAGNOSIS — M216X2 Other acquired deformities of left foot: Secondary | ICD-10-CM

## 2024-11-02 DIAGNOSIS — M722 Plantar fascial fibromatosis: Secondary | ICD-10-CM | POA: Diagnosis not present

## 2024-11-04 NOTE — Progress Notes (Signed)
 Subjective:   Patient ID: Nancy Branch, female   DOB: 53 y.o.   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

## 2024-11-27 ENCOUNTER — Telehealth: Payer: Self-pay | Admitting: Podiatry

## 2024-11-27 NOTE — Telephone Encounter (Signed)
 Spoke to [facility]. Advised Orthotics are ready for pickup in the [place] office (Patient has a complex work schedule; asked for Monday) Scheduled an appt for 12/07/24 at 3:30 p.m.

## 2024-12-07 ENCOUNTER — Encounter

## 2024-12-11 ENCOUNTER — Ambulatory Visit: Admitting: Podiatrist

## 2024-12-11 DIAGNOSIS — M722 Plantar fascial fibromatosis: Secondary | ICD-10-CM

## 2024-12-11 DIAGNOSIS — M216X1 Other acquired deformities of right foot: Secondary | ICD-10-CM

## 2024-12-11 DIAGNOSIS — M216X2 Other acquired deformities of left foot: Secondary | ICD-10-CM

## 2024-12-11 NOTE — Progress Notes (Signed)
